# Patient Record
Sex: Male | Born: 1975 | Race: Black or African American | Hispanic: No | Marital: Single | Smoking: Current every day smoker
Health system: Southern US, Community
[De-identification: ages and names within clinical notes are randomized; demographics above are authoritative.]

## PROBLEM LIST (undated history)

## (undated) DIAGNOSIS — I10 Essential (primary) hypertension: Secondary | ICD-10-CM

## (undated) DIAGNOSIS — J45909 Unspecified asthma, uncomplicated: Secondary | ICD-10-CM

## (undated) HISTORY — PX: NO PAST SURGERIES: SHX2092

## (undated) HISTORY — PX: OTHER SURGICAL HISTORY: SHX169

---

## 2016-03-18 ENCOUNTER — Emergency Department (HOSPITAL_COMMUNITY)
Admission: EM | Admit: 2016-03-18 | Discharge: 2016-03-18 | Disposition: A | Discharge status: Home / Self Care | Payer: Self-pay | Attending: Emergency Medicine | Admitting: Emergency Medicine

## 2016-03-18 ENCOUNTER — Emergency Department (HOSPITAL_COMMUNITY): Payer: Self-pay

## 2016-03-18 ENCOUNTER — Encounter (HOSPITAL_COMMUNITY): Payer: Self-pay | Admitting: Nurse Practitioner

## 2016-03-18 DIAGNOSIS — Z760 Encounter for issue of repeat prescription: Secondary | ICD-10-CM | POA: Insufficient documentation

## 2016-03-18 DIAGNOSIS — J45901 Unspecified asthma with (acute) exacerbation: Secondary | ICD-10-CM | POA: Insufficient documentation

## 2016-03-18 DIAGNOSIS — J40 Bronchitis, not specified as acute or chronic: Secondary | ICD-10-CM

## 2016-03-18 DIAGNOSIS — R000 Tachycardia, unspecified: Secondary | ICD-10-CM | POA: Insufficient documentation

## 2016-03-18 DIAGNOSIS — R030 Elevated blood-pressure reading, without diagnosis of hypertension: Secondary | ICD-10-CM | POA: Insufficient documentation

## 2016-03-18 DIAGNOSIS — F17210 Nicotine dependence, cigarettes, uncomplicated: Secondary | ICD-10-CM | POA: Insufficient documentation

## 2016-03-18 HISTORY — DX: Unspecified asthma, uncomplicated: J45.909

## 2016-03-18 MED ORDER — ALBUTEROL SULFATE (2.5 MG/3ML) 0.083% IN NEBU: 2.5000 mg | INHALATION_SOLUTION | Freq: Four times a day (QID) | RESPIRATORY_TRACT | Status: DC | PRN

## 2016-03-18 MED ORDER — BENZONATATE 200 MG PO CAPS: 200.0000 mg | ORAL_CAPSULE | Freq: Three times a day (TID) | ORAL | Status: DC | PRN

## 2016-03-18 MED ORDER — AZITHROMYCIN 250 MG PO TABS: 250.0000 mg | ORAL_TABLET | Freq: Every day | ORAL | Status: DC

## 2016-03-18 MED ORDER — PREDNISONE 10 MG PO TABS: 20.0000 mg | ORAL_TABLET | Freq: Two times a day (BID) | ORAL | Status: DC

## 2016-03-18 MED ORDER — IPRATROPIUM-ALBUTEROL 0.5-2.5 (3) MG/3ML IN SOLN
3.0000 mL | Freq: Once | RESPIRATORY_TRACT | Status: AC
  Administered 2016-03-18: 3 mL via RESPIRATORY_TRACT
  Filled 2016-03-18: qty 3

## 2016-03-18 MED ORDER — ALBUTEROL SULFATE HFA 108 (90 BASE) MCG/ACT IN AERS: 1.0000 | INHALATION_SPRAY | Freq: Four times a day (QID) | RESPIRATORY_TRACT | Status: DC | PRN

## 2016-03-18 NOTE — ED Notes (Signed)
Patient transported to X-ray 

## 2016-03-18 NOTE — ED Provider Notes (Signed)
CSN: [id number]     Arrival date & time 03/18/16  1238 History   First MD Initiated Contact with Patient 03/18/16 1429     Chief Complaint  Patient presents with  . Asthma  . Hypertension     (Consider location/radiation/quality/duration/timing/severity/associated sxs/prior Treatment) Patient is a 40 y.o. male presenting with asthma and hypertension. The history is provided by the patient.  Asthma This is a new problem. The current episode started in the past 7 days. The problem occurs constantly. The problem has been gradually worsening. Associated symptoms include congestion and coughing.  Hypertension Associated symptoms include congestion and coughing.   Milagros LollKevin Rubenstein is a 40 y.o. male who presents to the ED with productive cough with yellow sputum and wheezing that started a week ago and has gotten worse. He complains of sinus pain. He states he has had a sinus infection once before and this feels the same. Reports the past 2 days he has used his inhaler a lot and his neb treatments without relief. Patient is an every day smoker.   Patient also wants referral to PCP since [place] [facility] [place] and does not have a doctor. He reports he went to try and give plasma 3 days in a row and they would not take his blood because his BP was high.   Past Medical History  Diagnosis Date  . Asthma    History reviewed. No pertinent past surgical history. History reviewed. No pertinent family history. Social History  Substance Use Topics  . Smoking status: Current Every Day Smoker    Types: Cigarettes  . Smokeless tobacco: None  . Alcohol Use: Yes    Review of Systems  HENT: Positive for congestion and sinus pressure.   Respiratory: Positive for cough and wheezing.   all other systems negative    Allergies  Review of patient's allergies indicates no known allergies.  Home Medications   Prior to Admission medications   Medication Sig Start Date End Date Taking?  Authorizing Provider  albuterol (PROVENTIL HFA;VENTOLIN HFA) 108 (90 Base) MCG/ACT inhaler Inhale 1-2 puffs into the lungs every 6 (six) hours as needed for wheezing or shortness of breath. 03/18/16   Jarrah Babich Orlene OchM Franciszek Platten, NP  albuterol (PROVENTIL) (2.5 MG/3ML) 0.083% nebulizer solution Take 3 mLs (2.5 mg total) by nebulization every 6 (six) hours as needed for wheezing or shortness of breath. 03/18/16   Everly Rubalcava Orlene OchM Cella Cappello, NP  azithromycin (ZITHROMAX) 250 MG tablet Take 1 tablet (250 mg total) by mouth daily. Take first 2 tablets together, then 1 every day until finished. 03/18/16   Ivianna Notch Orlene OchM Ilyaas Musto, NP  benzonatate (TESSALON) 200 MG capsule Take 1 capsule (200 mg total) by mouth 3 (three) times daily as needed for cough. 03/18/16   Ismahan Lippman Orlene OchM Advit Trethewey, NP  predniSONE (DELTASONE) 10 MG tablet Take 2 tablets (20 mg total) by mouth 2 (two) times daily with a meal. 03/18/16   Harryette Shuart Orlene OchM Chinedu Agustin, NP   BP 139/98 mmHg  Pulse 101  Temp(Src) 98.5 F (36.9 C) (Oral)  Resp 18  Ht 5\' 6"  (1.676 m)  Wt 62.143 kg  BMI 22.12 kg/m2  SpO2 100% Physical Exam  Constitutional: He is oriented to person, place, and time. He appears well-developed and well-nourished.  HENT:  Head: Normocephalic and atraumatic.  Right Ear: Tympanic membrane normal.  Left Ear: Tympanic membrane normal.  Nose: Left sinus exhibits maxillary sinus tenderness.  Mouth/Throat: Uvula is midline, oropharynx is clear and moist and mucous membranes are normal.  Eyes: EOM are normal.  Neck: Neck supple.  Cardiovascular: Regular rhythm.  Tachycardia present.   Pulmonary/Chest: Effort normal. He has decreased breath sounds. He has wheezes (occasional). Rhonchi: occasional.  Abdominal: Soft. There is no tenderness.  Musculoskeletal: Normal range of motion.  Neurological: He is alert and oriented to person, place, and time. No cranial nerve deficit.  Skin: Skin is warm and dry.  Psychiatric: He has a normal mood and affect. His behavior is normal.  Nursing note and vitals  reviewed.   ED Course  Procedures (including critical care time) Duoneb  Re examined after neb treatment and there is better air movement. More wheezing heard after treatment but better air movement. Patient states he can breathe better.   Labs Review Labs Reviewed - No data to display  Imaging Review Dg Chest 2 View  03/18/2016  CLINICAL DATA:  Persistent cough; works in a dusty factory; coughing up yellowish phlegm; No fever; Smoker 1 PPD for 25 years; No known heart problems; HTN; Hx of asthma, COPD, hX OF BRONCHITIS EXAM: CHEST - 2 VIEW COMPARISON:  None available FINDINGS: Lungs are clear. Heart size and mediastinal contours are within normal limits. No effusion.  No pneumothorax. Visualized skeletal structures are unremarkable. IMPRESSION: No acute cardiopulmonary disease. Electronically Signed   By: Corlis Leak M.D.   On: 03/18/2016 15:04    MDM  40 y.o. male with cough, congestion, wheezing and sinus pressure x 1 week stable for d/c with improvement after neb treatment. Will give Rx for refill of his albuterol inhaler, and neb tx medication. Will give Tessalon, prednisone and Z-pak. Patient to f/u with [facility]. He will return here as needed for worsening symptoms.  Discussed with the patient clinical and x-ray findings and plan of care and all questioned fully answered. Stable for d/c without respiratory distress and 02 SAT 100% on R/A and normal x-ray.   Final diagnoses:  Bronchitis       Janne Napoleon, NP 03/18/16 1542  Vanetta Mulders, MD 03/19/16 [phone]

## 2016-03-18 NOTE — ED Notes (Signed)
He c/o 1 week history of nasal congestion, wheezing, dry cough, body aches. His asthma pump ran out and he has no way to obtain refill. He also has been checking his blood pressure at the drug store and it has been elevated but hes never been diagnosed with high blood pressure. He is alert and breathing easily now

## 2017-02-06 ENCOUNTER — Emergency Department (HOSPITAL_COMMUNITY)
Admission: EM | Admit: 2017-02-06 | Discharge: 2017-02-06 | Discharge status: Against Medical Advice | Payer: Medicaid - Out of State

## 2017-02-06 NOTE — ED Notes (Signed)
Called patient, no response

## 2017-02-06 NOTE — ED Notes (Signed)
Called with no answer 

## 2017-02-06 NOTE — ED Notes (Signed)
Pt states he no longer wants to be seen.

## 2018-01-13 ENCOUNTER — Other Ambulatory Visit: Payer: Self-pay

## 2018-01-13 ENCOUNTER — Emergency Department (HOSPITAL_COMMUNITY)
Admission: EM | Admit: 2018-01-13 | Discharge: 2018-01-13 | Discharge status: Against Medical Advice | Payer: Medicaid - Out of State

## 2018-01-13 NOTE — ED Triage Notes (Signed)
Patient did not answer when called for triage x 3 

## 2018-01-16 ENCOUNTER — Encounter (HOSPITAL_COMMUNITY): Payer: Self-pay | Admitting: Emergency Medicine

## 2018-01-16 ENCOUNTER — Emergency Department (HOSPITAL_COMMUNITY)
Admission: EM | Admit: 2018-01-16 | Discharge: 2018-01-16 | Disposition: A | Discharge status: Home / Self Care | Payer: Medicaid - Out of State | Attending: Emergency Medicine | Admitting: Emergency Medicine

## 2018-01-16 DIAGNOSIS — F17210 Nicotine dependence, cigarettes, uncomplicated: Secondary | ICD-10-CM | POA: Insufficient documentation

## 2018-01-16 DIAGNOSIS — I159 Secondary hypertension, unspecified: Secondary | ICD-10-CM

## 2018-01-16 DIAGNOSIS — Z79899 Other long term (current) drug therapy: Secondary | ICD-10-CM | POA: Insufficient documentation

## 2018-01-16 DIAGNOSIS — J45909 Unspecified asthma, uncomplicated: Secondary | ICD-10-CM | POA: Insufficient documentation

## 2018-01-16 DIAGNOSIS — I10 Essential (primary) hypertension: Secondary | ICD-10-CM | POA: Insufficient documentation

## 2018-01-16 HISTORY — DX: Essential (primary) hypertension: I10

## 2018-01-16 LAB — BASIC METABOLIC PANEL
ANION GAP: 14 (ref 5–15)
CALCIUM: 9.5 mg/dL (ref 8.9–10.3)
CO2: 24 mmol/L (ref 22–32)
Chloride: 101 mmol/L (ref 101–111)
Creatinine, Ser: 0.92 mg/dL (ref 0.61–1.24)
GFR calc Af Amer: >60 mL/min (ref >60)
GLUCOSE: 93 mg/dL (ref 65–99)
POTASSIUM: 3.6 mmol/L (ref 3.5–5.1)
SODIUM: 139 mmol/L (ref 135–145)

## 2018-01-16 LAB — CBC
HCT: 42.3 % (ref 39.0–52.0)
Hemoglobin: 14.7 g/dL (ref 13.0–17.0)
MCH: 32.5 pg (ref 26.0–34.0)
MCHC: 34.8 g/dL (ref 30.0–36.0)
MCV: 93.6 fL (ref 78.0–100.0)
PLATELETS: 266 10*3/uL (ref 150–400)
RBC: 4.52 MIL/uL (ref 4.22–5.81)
RDW: 12.5 % (ref 11.5–15.5)
WBC: 4.2 10*3/uL (ref 4.0–10.5)

## 2018-01-16 MED ORDER — HYDROCHLOROTHIAZIDE 25 MG PO TABS: 25.0000 mg | ORAL_TABLET | Freq: Every day | ORAL | 0 refills | Status: DC

## 2018-01-16 NOTE — Discharge Instructions (Signed)
Please call to schedule an appointment.  Take medication as prescribed.  Return the ED with any worsening symptoms including chest pain, shortness breath, headache or vision changes.

## 2018-01-16 NOTE — ED Notes (Signed)
See provider assessment 

## 2018-01-16 NOTE — ED Notes (Signed)
Pty stable and ambulatory for d/c states understanding follow up

## 2018-01-16 NOTE — ED Triage Notes (Signed)
Pt here for eval of HBP. Pt states he moved 2 yrs ago and doesn't have PCP. Pt is asymptomatic

## 2018-01-17 NOTE — ED Provider Notes (Signed)
[facility] Provider Note   CSN: [id number] Arrival date & time: 01/16/18  2042     History   Chief Complaint Chief Complaint  Patient presents with  . Hypertension    HPI Anthony Zavala is a 42 y.o. male.  HPI 42 year old African-American male with no significant past medical history presents to the ED for evaluation of high blood pressure.  Patient denies any history of hypertension.  Does report history of hyperlipidemia.  He states this was diagnosed 4 years ago his physical.  Patient states that [place] ago [place].  Patient has not establish care.  He reports having ongoing high blood pressure.  Patient denies any associated symptoms with this including chest pain, shortness of breath, vision changes, headache, lightheadedness or dizziness.  Patient states that people have told him of his blood pressure is high he may have a stroke.  Patient has not seen a primary care doctor to get his blood pressure under control.  He does report being very anxious.  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  Patient states that his blood pressure is high when he goes into the jail and they will not let him in with high blood pressure.  Again patient denies any associated symptoms.  He is requesting blood pressure medication at a primary care follow-up.  Pt denies any fever, chill, ha, vision changes, lightheadedness, dizziness, congestion, neck pain, cp, sob, cough, abd pain, n/v/d, urinary symptoms, change in bowel habits, melena, hematochezia, lower extremity paresthesias.  Past Medical History:  Diagnosis Date  . Asthma   . Hypertension     There are no active problems to display for this patient.   History reviewed. No pertinent surgical history.     Home Medications    Prior to Admission medications   Medication Sig Start Date End Date Taking?  Authorizing Provider  albuterol (PROVENTIL HFA;VENTOLIN HFA) 108 (90 Base) MCG/ACT inhaler Inhale 1-2 puffs into the lungs every 6 (six) hours as needed for wheezing or shortness of breath. 03/18/16   Janne NapoleonNeese, Hope M, NP  albuterol (PROVENTIL) (2.5 MG/3ML) 0.083% nebulizer solution Take 3 mLs (2.5 mg total) by nebulization every 6 (six) hours as needed for wheezing or shortness of breath. 03/18/16   Janne NapoleonNeese, Hope M, NP  azithromycin (ZITHROMAX) 250 MG tablet Take 1 tablet (250 mg total) by mouth daily. Take first 2 tablets together, then 1 every day until finished. 03/18/16   Janne NapoleonNeese, Hope M, NP  benzonatate (TESSALON) 200 MG capsule Take 1 capsule (200 mg total) by mouth 3 (three) times daily as needed for cough. 03/18/16   Janne NapoleonNeese, Hope M, NP  hydrochlorothiazide (HYDRODIURIL) 25 MG tablet Take 1 tablet (25 mg total) by mouth daily. 01/16/18   Rise MuLeaphart, Eh Sauseda T, PA-C  predniSONE (DELTASONE) 10 MG tablet Take 2 tablets (20 mg total) by mouth 2 (two) times daily with a meal. 03/18/16   Janne NapoleonNeese, Hope M, NP    Family History No family history on file.  Social History Social History   Tobacco Use  . Smoking status: Current Every Day Smoker    Types: Cigarettes  Substance Use Topics  . Alcohol use: Yes  . Drug use: No     Allergies   Patient has no known allergies.   Review of Systems Review of Systems  Constitutional: Negative for chills, diaphoresis and fever.  HENT: Negative for congestion.  Eyes: Negative for visual disturbance.  Respiratory: Negative for cough and shortness of breath.   Cardiovascular: Negative for chest pain, palpitations and leg swelling.  Gastrointestinal: Negative for abdominal pain, diarrhea, nausea and vomiting.  Genitourinary: Negative for dysuria, flank pain, frequency, hematuria and urgency.  Musculoskeletal: Negative for arthralgias and myalgias.  Skin: Negative for rash.  Neurological: Negative for dizziness, syncope, weakness, light-headedness, numbness and  headaches.  Psychiatric/Behavioral: Negative for sleep disturbance. The patient is not nervous/anxious.      Physical Exam Updated Vital Signs BP (!) 152/123 (BP Location: Right Arm)   Pulse 97   Temp 97.9 F (36.6 C) (Oral)   Resp 17   Ht 5\' 8"  (1.727 m)   Wt 74.8 kg (165 lb)   SpO2 94%   BMI 25.09 kg/m   Physical Exam  Constitutional: He appears well-developed and well-nourished. No distress.  HENT:  Head: Normocephalic and atraumatic.  Eyes: Right eye exhibits no discharge. Left eye exhibits no discharge. No scleral icterus.  Neck: Normal range of motion.  Cardiovascular: Normal rate, regular rhythm, normal heart sounds and intact distal pulses. Exam reveals no gallop and no friction rub.  No murmur heard. Pulmonary/Chest: Effort normal and breath sounds normal. No stridor. No respiratory distress. He has no wheezes. He has no rales. He exhibits no tenderness.  Musculoskeletal: Normal range of motion.  Neurological: He is alert.  Skin: No pallor.  Psychiatric: His behavior is normal. Judgment and thought content normal.  Nursing note and vitals reviewed.    ED Treatments / Results  Labs (all labs ordered are listed, but only abnormal results are displayed) Labs Reviewed  BASIC METABOLIC PANEL - Abnormal; Notable for the following components:      Result Value   BUN <5 (*)    All other components within normal limits  CBC    EKG  EKG Interpretation None       Radiology No results found.  Procedures Procedures (including critical care time)  Medications Ordered in ED Medications - No data to display   Initial Impression / Assessment and Plan / ED Course  I have reviewed the triage vital signs and the nursing notes.  Pertinent labs & imaging results that were available during my care of the patient were reviewed by me and considered in my medical decision making (see chart for details).     Patient noted to be hypertensive in the emergency  department.  Normal lab work including creatinine.  Patient denies any associated symptoms of chest pain or shortness of breath.  I do not feel that cardiac workup is indicated.  Patient has a symptom medic hypertension.  Patient has had very difficult time finding primary care.  Patient is incarcerated on the weekends and states that he needs his blood pressure under control.  Discussed with my attending and will prescribe patient with a short course of HCTZ.  Have discussed with care management and they have given patient follow-up with [facility].  Patient instructed to follow-up tomorrow with an appointment.  No signs of hypertensive urgency.  Pt is hemodynamically stable, in NAD, & able to ambulate in the ED. Evaluation does not show pathology that would require ongoing emergent intervention or inpatient treatment. I explained the diagnosis to the patient. Pain has been managed & has no complaints prior to dc. Pt is comfortable with above plan and is stable for discharge at this time. All questions were answered prior to disposition. Strict return precautions for f/u to  the ED were discussed. Encouraged follow up with PCP.   Final Clinical Impressions(s) / ED Diagnoses   Final diagnoses:  Secondary hypertension    ED Discharge Orders        Ordered    hydrochlorothiazide (HYDRODIURIL) 25 MG tablet  Daily     01/16/18 2333       Rise Mu, PA-C 01/17/18 0035    Raeford Razor, MD 01/24/18 [phone]

## 2018-08-09 ENCOUNTER — Encounter (HOSPITAL_COMMUNITY): Payer: Self-pay | Admitting: *Deleted

## 2018-08-09 ENCOUNTER — Emergency Department (HOSPITAL_COMMUNITY)
Admission: EM | Admit: 2018-08-09 | Discharge: 2018-08-09 | Disposition: A | Discharge status: Home / Self Care | Payer: Self-pay | Attending: Emergency Medicine | Admitting: Emergency Medicine

## 2018-08-09 ENCOUNTER — Other Ambulatory Visit: Payer: Self-pay

## 2018-08-09 ENCOUNTER — Emergency Department (HOSPITAL_COMMUNITY): Payer: Self-pay

## 2018-08-09 DIAGNOSIS — Z79899 Other long term (current) drug therapy: Secondary | ICD-10-CM | POA: Insufficient documentation

## 2018-08-09 DIAGNOSIS — S42035A Nondisplaced fracture of lateral end of left clavicle, initial encounter for closed fracture: Secondary | ICD-10-CM | POA: Insufficient documentation

## 2018-08-09 DIAGNOSIS — Y929 Unspecified place or not applicable: Secondary | ICD-10-CM | POA: Insufficient documentation

## 2018-08-09 DIAGNOSIS — S42002A Fracture of unspecified part of left clavicle, initial encounter for closed fracture: Secondary | ICD-10-CM

## 2018-08-09 DIAGNOSIS — Y990 Civilian activity done for income or pay: Secondary | ICD-10-CM | POA: Insufficient documentation

## 2018-08-09 DIAGNOSIS — J45909 Unspecified asthma, uncomplicated: Secondary | ICD-10-CM | POA: Insufficient documentation

## 2018-08-09 DIAGNOSIS — I10 Essential (primary) hypertension: Secondary | ICD-10-CM | POA: Insufficient documentation

## 2018-08-09 DIAGNOSIS — M545 Low back pain: Secondary | ICD-10-CM | POA: Insufficient documentation

## 2018-08-09 DIAGNOSIS — W208XXA Other cause of strike by thrown, projected or falling object, initial encounter: Secondary | ICD-10-CM | POA: Insufficient documentation

## 2018-08-09 DIAGNOSIS — F17210 Nicotine dependence, cigarettes, uncomplicated: Secondary | ICD-10-CM | POA: Insufficient documentation

## 2018-08-09 DIAGNOSIS — Y9389 Activity, other specified: Secondary | ICD-10-CM | POA: Insufficient documentation

## 2018-08-09 MED ORDER — OXYCODONE-ACETAMINOPHEN 5-325 MG PO TABS: 1.0000 | ORAL_TABLET | Freq: Four times a day (QID) | ORAL | 0 refills | Status: DC | PRN

## 2018-08-09 NOTE — ED Provider Notes (Signed)
[facility] EM[facility]GENC[facility]PART[facility] Provider Note   CSN: [id number] Arrival date & time: 08/09/18  0747     History   Chief Complaint Chief Complaint  Patient presents with  . Fall    HPI Anthony Zavala is a 42 y.o. male with a history of asthma and hypertension who presents to the emergency department status post injury 3 days prior with complaints of L shoulder pain and lower back pain. 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  No head injury or loss of consciousness.  He states he is been having constant severe pain to the left shoulder and moderate pain to the lower back.  It is worse with movement, mildly alleviated with Goody powder at home.  States he feels like he cannot move the left shoulder secondary to pain, not necessarily weakness.  Denies numbness, tingling, weakness, or any other areas of injury.  HPI  Past Medical History:  Diagnosis Date  . Asthma   . Hypertension     There are no active problems to display for this patient.   History reviewed. No pertinent surgical history.      Home Medications    Prior to Admission medications   Medication Sig Start Date End Date Taking? Authorizing Provider  albuterol (PROVENTIL HFA;VENTOLIN HFA) 108 (90 Base) MCG/ACT inhaler Inhale 1-2 puffs into the lungs every 6 (six) hours as needed for wheezing or shortness of breath. 03/18/16   Janne NapoleonNeese, Hope M, NP  albuterol (PROVENTIL) (2.5 MG/3ML) 0.083% nebulizer solution Take 3 mLs (2.5 mg total) by nebulization every 6 (six) hours as needed for wheezing or shortness of breath. 03/18/16   Janne NapoleonNeese, Hope M, NP  azithromycin (ZITHROMAX) 250 MG tablet Take 1 tablet (250 mg total) by mouth daily. Take first 2 tablets together, then 1 every day until finished. 03/18/16   Janne NapoleonNeese, Hope M, NP  benzonatate (TESSALON) 200 MG  capsule Take 1 capsule (200 mg total) by mouth 3 (three) times daily as needed for cough. 03/18/16   Janne NapoleonNeese, Hope M, NP  hydrochlorothiazide (HYDRODIURIL) 25 MG tablet Take 1 tablet (25 mg total) by mouth daily. 01/16/18   Rise MuLeaphart, Kenneth T, PA-C  predniSONE (DELTASONE) 10 MG tablet Take 2 tablets (20 mg total) by mouth 2 (two) times daily with a meal. 03/18/16   Janne NapoleonNeese, Hope M, NP    Family History History reviewed. No pertinent family history.  Social History Social History   Tobacco Use  . Smoking status: Current Every Day Smoker    Types: Cigarettes  Substance Use Topics  . Alcohol use: Yes  . Drug use: No    Allergies   Patient has no known allergies.   Review of Systems Review of Systems  Cardiovascular: Negative for chest pain.  Gastrointestinal: Negative for abdominal pain.  Musculoskeletal: Positive for arthralgias (L shoulder) and back pain.  Neurological: Negative for weakness and numbness.       Negative for paresthesias     Physical Exam Updated Vital Signs BP (!) 140/107 (BP Location: Right Arm)   Pulse 87   Temp 98 F (36.7 C) (Oral)   Resp 20   SpO2 100%   Physical Exam  Constitutional: He appears well-developed and well-nourished.  Non-toxic appearance. No distress.  HENT:  Head: Normocephalic and atraumatic.  Eyes: Pupils are equal, round, and reactive  to light. Conjunctivae are normal. Right eye exhibits no discharge. Left eye exhibits no discharge.  Neck: Normal range of motion. Neck supple. No spinous process tenderness and no muscular tenderness present.  Cardiovascular: Normal rate and regular rhythm.  Pulses:      Radial pulses are 2+ on the right side, and 2+ on the left side.  Pulmonary/Chest: Effort normal and breath sounds normal. No respiratory distress. He has no wheezes. He has no rhonchi. He has no rales.  Respiration even and unlabored  Abdominal: Soft. He exhibits no distension. There is no tenderness.  Musculoskeletal:  No  appreciable swelling, erythema, ecchymosis, or open wounds. Upper extremities: Patient has normal range of motion at all joints of the right upper extremity as well as to the elbow wrist and all digits of the left upper extremity.  He is able to somewhat move the left shoulder in all directions, however significant limitation in flexion and abduction equivocal to pain.  He is tender to palpation in to lateral aspect of the clavicle, left AC joint area as well as the diffuse glenohumeral joint. Upper extremities are otherwise nontender. Back: Patient has diffuse midline lumbar tenderness to palpation which extends to bilateral lumbar paraspinal muscles, no point/focal tenderness, no palpable step-off.  No tenderness to thoracic or cervical areas. Extremities: Normal range of motion.  Nontender.  Neurological: He is alert.  Clear speech. 5/5 symmetric grip strength. 5/5 strength with plantar/dorsiflexion bilaterally.  Sensation grossly intact bilateral upper and lower extremities.  Patient able to perform okay sign, thumbs up, and cross second and third digits.  Patient is ambulatory without difficulty.  Skin: Skin is warm and dry. No rash noted.  Psychiatric: He has a normal mood and affect. His behavior is normal. Thought content normal.  Nursing note and vitals reviewed.    ED Treatments / Results  Labs (all labs ordered are listed, but only abnormal results are displayed) Labs Reviewed - No data to display  EKG None  Radiology Dg Lumbar Spine Complete  Result Date: 08/09/2018 CLINICAL DATA:  Low back pain following fall 2 days ago, initial encounter EXAM: LUMBAR SPINE - COMPLETE 4+ VIEW COMPARISON:  None. FINDINGS: Five lumbar type vertebral bodies are well visualized. Vertebral body height is well maintained. No pars defects are seen. No anterolisthesis is noted. No soft tissue abnormality is noted. IMPRESSION: No acute abnormality noted. Electronically Signed   By: Alcide Clever M.D.   On:  08/09/2018 09:01   Dg Shoulder Left  Result Date: 08/09/2018 CLINICAL DATA:  Left shoulder pain following fall 2 days ago with persistent pain, initial encounter EXAM: LEFT SHOULDER - 2+ VIEW COMPARISON:  None. FINDINGS: Humeral head is well seated. Mild degenerative changes of the acromioclavicular joint is seen. Additionally there is cortical irregularities consistent with a undisplaced fracture of the distal clavicle. A lucency is noted in the superior aspect of the proximal clavicle which may also represent a focal avulsion. No soft tissue abnormality is seen. No other focal abnormality is noted. IMPRESSION: Changes suspicious for undisplaced fractures in the proximal and distal clavicle on the left. Electronically Signed   By: Alcide Clever M.D.   On: 08/09/2018 08:57    Procedures Procedures (including critical care time)  SPLINT APPLICATION Date/Time: 9:38 AM Authorized by: Harvie Heck Consent: Verbal consent obtained. Risks and benefits: risks, benefits and alternatives were discussed Consent given by: patient Splint applied by: orthopedic technician Location details: LUE Splint type: sling/immobilizer Post-procedure: The splinted body part was neurovascularly  unchanged following the procedure. Patient tolerance: Patient tolerated the procedure well with no immediate complications.  Medications Ordered in ED Medications - No data to display   Initial Impression / Assessment and Plan / ED Course  I have reviewed the triage vital signs and the nursing notes.  Pertinent labs & imaging results that were available during my care of the patient were reviewed by me and considered in my medical decision making (see chart for details).   Patient presents to the ED s/p mechanical injury with dresser falling onto L shoulder with subsequent fall to the ground. Nontoxic appearing, vitals WNL other than elevated BP, doubt HTN emergency, patient aware of need for recheck. L shoulder  and lumbar area diffusely tender to palpation- plain films will be obtained.  No evidence of serious head, neck, or back injury, per [place] head CT/C-spine rules do not feel that further imaging is necessary in these locations, plain film of lumbar area negative, patient without point/focal tenderness or noted neurologic deficits- do not suspect fracture/dislocation of the spine or head bleed.  Left shoulder x-ray suspicious for undisplaced fractures in the proximal and distal clavicle on the left-he is neurovascularly intact distally will place in a sling with orthopedics follow-up and pain control. [place] Controlled Substance reporting System queried. Work note provided. I discussed results, treatment plan, need for  follow-up, and return precautions with the patient. Provided opportunity for questions, patient confirmed understanding and is in agreement with plan.   Findings and plan of care discussed with supervising physician Dr. Rush Landmark- in agreement.   Final Clinical Impressions(s) / ED Diagnoses   Final diagnoses:  Closed nondisplaced fracture of left clavicle, unspecified part of clavicle, initial encounter    ED Discharge Orders         Ordered    oxyCODONE-acetaminophen (PERCOCET/ROXICET) 5-325 MG tablet  Every 6 hours PRN     08/09/18 0948           Cherly Anderson, PA-C 08/09/18 1610    Tegeler, Canary Brim, MD 08/09/18 [phone]

## 2018-08-09 NOTE — ED Triage Notes (Signed)
Pt in c/o left shoulder pain after a fall at work on Thursday, pain started the next day and has gotten worse, also some back pain, no distress noted

## 2018-08-09 NOTE — Discharge Instructions (Signed)
Please read and follow all provided instructions.  You have been seen today for left shoulder pain and lower back pain.  Your left shoulder x-ray does show that you have fractures to your clavicle, we are placing you in a sling, please wear this at all times you have followed up with the orthopedic doctor.  We are sending you home with pain medicines.  We would like you to follow-up with the orthopedic doctor your discharge instruction in 3 to 5 days for reevaluation and further management.  Tests performed today include: X-ray of the lower back: Normal X-ray of the left shoulder: Shows that you have nondisplaced fractures to both ends of your clavicle on the left side. Vital signs. See below for your results today.   Home care instructions: -- *PRICE in the first 24-48 hours after injury: Protect (with brace, splint, sling), if given by your provider Rest Ice- Do not apply ice pack directly to your skin, place towel or similar between your skin and ice/ice pack. Apply ice for 20 min, then remove for 40 min while awake Compression- Wear brace, elastic bandage, splint as directed by your provider Elevate affected extremity above the level of your heart when not walking around for the first 24-48 hours   Medications:  -Percocet-this is a narcotic/controlled substance medication that has potential addicting qualities.  We recommend that you take 1-2 tablets every 6 hours as needed for severe pain.  Do not drive or operate heavy machinery when taking this medicine as it can be sedating. Do not drink alcohol or take other sedating medications when taking this medicine for safety reasons.  Keep this out of reach of small children.  Please be aware this medicine has Tylenol in it (325 mg/tab) do not exceed the maximum dose of Tylenol in a day per over the counter recommendations should you decide to supplement with Tylenol over the counter.   We would also like you to take an NSAID over-the-counter for  pain such as ibuprofen, Advil, Aleve, or Motrin-use over-the-counter dosing instructions.  We have prescribed you new medication(s) today. Discuss the medications prescribed today with your pharmacist as they can have adverse effects and interactions with your other medicines including over the counter and prescribed medications. Seek medical evaluation if you start to experience new or abnormal symptoms after taking one of these medicines, seek care immediately if you start to experience difficulty breathing, feeling of your throat closing, facial swelling, or rash as these could be indications of a more serious allergic reaction   Follow-up instructions: Follow-up with Dr. Magnus IvanBlackman in 3 to 5 days.  Return instructions:  Please return if your digits or extremity are numb or tingling, appear gray or blue, or you have severe pain (also elevate the extremity and loosen splint or wrap if you were given one) Please return if you have redness or fevers.  Please return to the Emergency Department if you experience worsening symptoms.  Please return if you have any other emergent concerns. Additional Information:  Your vital signs today were: BP (!) 140/107 (BP Location: Right Arm)    Pulse 87    Temp 98 F (36.7 C) (Oral)    Resp 20    SpO2 100%  If your blood pressure (BP) was elevated above 135/85 this visit, please have this repeated by your doctor within one month. ---------------

## 2018-08-12 ENCOUNTER — Ambulatory Visit (INDEPENDENT_AMBULATORY_CARE_PROVIDER_SITE_OTHER): Payer: Self-pay | Admitting: Family Medicine

## 2018-08-12 DIAGNOSIS — S42035A Nondisplaced fracture of lateral end of left clavicle, initial encounter for closed fracture: Secondary | ICD-10-CM

## 2018-08-12 DIAGNOSIS — M25511 Pain in right shoulder: Secondary | ICD-10-CM

## 2018-08-12 NOTE — Progress Notes (Signed)
   Office Visit Note   Patient: Anthony Zavala           Date of Birth: 04/16/1976           MRN: [id number] Visit Date: 08/12/2018 Requested by: No referring provider defined for this encounter. PCP: Patient, No Pcp Per  Subjective: Chief Complaint  Patient presents with  . Left Shoulder - Injury, Pain    Left clavicle fracture - fell on pavement 08/09/18. [facility]. In [place]. Right-hand dominant.    HPI: He is a 42 year old right-hand-dominant male with right shoulder pain and left clavicle fracture.  On September 14 he was running, tripped and fell landing on his shoulders.  Immediate pain in both shoulders.  He continued to have pain so he went to the hospital where x-rays revealed left shoulder distal clavicle fracture and possibly proximal clavicle avulsion fracture.  He was given a shoulder sling for comfort.  He states that his right shoulder hurts more than the left, very difficult to raise his arm overhead.  He has been in good physical health.  He works for a [employer].  He has been unable to work since his injuries.  He smokes cigarettes and is trying to quit.              ROS: Otherwise noncontributory.  Objective: Vital Signs: There were no vitals taken for this visit.  Physical Exam:  Right shoulder: Full active range of motion with pain reaching overhead.  Pain with empty can test but still good rotator cuff strength.  Speeds test is negative.  No detectable instability. Left shoulder: Tender at the proximal and distal clavicle.  There is slight bony prominence of the [facility] compared to the right side.  Did not put him through active range of motion in the left shoulder.  Imaging: X-rays from the hospital were reviewed.  Assessment & Plan: 1.  Left shoulder pain 3 days status post fall with distal and probably proximal clavicle fractures, question injury to the coracoclavicular ligament. -Shoulder sling for comfort, gentle pendulum exercises for  range of motion.  Return in 2 weeks for repeat clavicle x-ray.  May be able to return to light duty work in 3 weeks, with regular duty in 6 weeks if clinically healed.  2.  Right shoulder pain, suspect rotator cuff strain -Range of motion as tolerated.  Additional imaging if pain persist.   Follow-Up Instructions: Return in about 3 weeks (around 09/02/2018).       Procedures: None today.   PMFS History: There are no active problems to display for this patient.  Past Medical History:  Diagnosis Date  . Asthma   . Hypertension     No family history on file.  No past surgical history on file. Social History   Occupational History  . Not on file  Tobacco Use  . Smoking status: Current Every Day Smoker    Types: Cigarettes  Substance and Sexual Activity  . Alcohol use: Yes  . Drug use: No  . Sexual activity: Not on file

## 2018-08-13 ENCOUNTER — Encounter (INDEPENDENT_AMBULATORY_CARE_PROVIDER_SITE_OTHER): Payer: Self-pay

## 2018-08-13 ENCOUNTER — Telehealth (INDEPENDENT_AMBULATORY_CARE_PROVIDER_SITE_OTHER): Payer: Self-pay | Admitting: Family Medicine

## 2018-08-13 ENCOUNTER — Other Ambulatory Visit (INDEPENDENT_AMBULATORY_CARE_PROVIDER_SITE_OTHER): Payer: Self-pay | Admitting: Family Medicine

## 2018-08-13 MED ORDER — OXYCODONE-ACETAMINOPHEN 5-325 MG PO TABS: 1.0000 | ORAL_TABLET | Freq: Three times a day (TID) | ORAL | 0 refills | Status: DC | PRN

## 2018-08-13 NOTE — Telephone Encounter (Signed)
Patient advised.  He will come and pick up the note from the front desk.

## 2018-08-13 NOTE — Telephone Encounter (Signed)
Rx sent.  Work note printed again.

## 2018-08-13 NOTE — Telephone Encounter (Signed)
Need pharmacy info and I will send electronically.

## 2018-08-13 NOTE — Telephone Encounter (Signed)
The patient uses [facility] on [address].  He does need a note that says he is out of work and for how long.

## 2018-08-13 NOTE — Telephone Encounter (Signed)
Please advise 

## 2018-08-13 NOTE — Telephone Encounter (Signed)
Patient came in to state needed pain meds-seen Dr Prince RomeHilts yesterday and forgot to mention he needed some. He only received 5 from ED.  He also needs a note for work to state he will be out of work.  Please call this patient as soon as you can he was wanting to sit and wait to get a RX.  [phone]

## 2018-08-22 ENCOUNTER — Emergency Department (HOSPITAL_COMMUNITY): Payer: Self-pay

## 2018-08-22 ENCOUNTER — Emergency Department (HOSPITAL_COMMUNITY)
Admission: EM | Admit: 2018-08-22 | Discharge: 2018-08-22 | Disposition: A | Discharge status: Home / Self Care | Payer: Self-pay | Attending: Emergency Medicine | Admitting: Emergency Medicine

## 2018-08-22 ENCOUNTER — Encounter (HOSPITAL_COMMUNITY): Payer: Self-pay | Admitting: Emergency Medicine

## 2018-08-22 ENCOUNTER — Other Ambulatory Visit: Payer: Self-pay

## 2018-08-22 DIAGNOSIS — S43421A Sprain of right rotator cuff capsule, initial encounter: Secondary | ICD-10-CM | POA: Insufficient documentation

## 2018-08-22 DIAGNOSIS — Y929 Unspecified place or not applicable: Secondary | ICD-10-CM | POA: Insufficient documentation

## 2018-08-22 DIAGNOSIS — J45909 Unspecified asthma, uncomplicated: Secondary | ICD-10-CM | POA: Insufficient documentation

## 2018-08-22 DIAGNOSIS — Y990 Civilian activity done for income or pay: Secondary | ICD-10-CM | POA: Insufficient documentation

## 2018-08-22 DIAGNOSIS — I10 Essential (primary) hypertension: Secondary | ICD-10-CM | POA: Insufficient documentation

## 2018-08-22 DIAGNOSIS — W230XXA Caught, crushed, jammed, or pinched between moving objects, initial encounter: Secondary | ICD-10-CM | POA: Insufficient documentation

## 2018-08-22 DIAGNOSIS — Y939 Activity, unspecified: Secondary | ICD-10-CM | POA: Insufficient documentation

## 2018-08-22 DIAGNOSIS — F17210 Nicotine dependence, cigarettes, uncomplicated: Secondary | ICD-10-CM | POA: Insufficient documentation

## 2018-08-22 DIAGNOSIS — Z79899 Other long term (current) drug therapy: Secondary | ICD-10-CM | POA: Insufficient documentation

## 2018-08-22 DIAGNOSIS — S43429D Sprain of unspecified rotator cuff capsule, subsequent encounter: Secondary | ICD-10-CM

## 2018-08-22 MED ORDER — MELOXICAM 15 MG PO TABS
15.0000 mg | ORAL_TABLET | Freq: Every day | ORAL | 0 refills | Status: DC
Start: 2018-08-22 — End: 2018-11-04

## 2018-08-22 MED ORDER — OXYCODONE-ACETAMINOPHEN 5-325 MG PO TABS
1.0000 | ORAL_TABLET | ORAL | Status: DC | PRN
  Administered 2018-08-22: 1 via ORAL
  Filled 2018-08-22: qty 1

## 2018-08-22 NOTE — ED Triage Notes (Signed)
Pt reports fall one week ago to rt shoulder but broke left clavicle. Pt was seen and treated for the left arm, but reports rt arm hurts the most. Pt wearing sling to left arm. Denies further injuries. Pt reports a popping noise and pain when lifting right arm. Pt has been taking pain medication prescription but has ran out. Pt reports he has been taking ibuprofen with no relief in the mean time.

## 2018-08-22 NOTE — ED Provider Notes (Signed)
[facility] EMERGENCY DEPARTMENT Provider Note   CSN: [id number] Arrival date & time: 08/22/18  1630     History   Chief Complaint Chief Complaint  Patient presents with  . Shoulder Pain    HPI Anthony Zavala is a 42 y.o. male who presents the emergency department for evaluation of right shoulder pain.Patient was dx with left clavicle fracture on 08/09/2018 after injury and work where a dresser fell on him. He was seen by Dr. Prince Rome at [facility] orthopedics. He was diagnosed with r rotator cuff strain. He Received 10 Percocet tablets from the ER on 08/09/2017 and 30 tablets on 08/13/2018.  Patient states that he is having a lot of difficulty sleeping because he is on his back which is uncomfortable.  He states that his pain is worse when he first wakes up and try to sleep at night.  Percocet he was taking was helpful however he has he has been using ibuprofen is not significant relief.  Patient was unaware that he was diagnosed with a right rotator cuff tear.  He denies numbness tingling or weakness.  Pain is worse when he tries to raise his shoulder above 90 degrees.  HPI  Past Medical History:  Diagnosis Date  . Asthma   . Hypertension     There are no active problems to display for this patient.   History reviewed. No pertinent surgical history.      Home Medications    Prior to Admission medications   Medication Sig Start Date End Date Taking? Authorizing Provider  albuterol (PROVENTIL HFA;VENTOLIN HFA) 108 (90 Base) MCG/ACT inhaler Inhale 1-2 puffs into the lungs every 6 (six) hours as needed for wheezing or shortness of breath. 03/18/16   Janne Napoleon, NP  albuterol (PROVENTIL) (2.5 MG/3ML) 0.083% nebulizer solution Take 3 mLs (2.5 mg total) by nebulization every 6 (six) hours as needed for wheezing or shortness of breath. Patient not taking: Reported on 08/12/2018 03/18/16   Janne Napoleon, NP  azithromycin (ZITHROMAX) 250 MG tablet Take 1 tablet (250 mg  total) by mouth daily. Take first 2 tablets together, then 1 every day until finished. Patient not taking: Reported on 08/12/2018 03/18/16   Janne Napoleon, NP  benzonatate (TESSALON) 200 MG capsule Take 1 capsule (200 mg total) by mouth 3 (three) times daily as needed for cough. Patient not taking: Reported on 08/12/2018 03/18/16   Janne Napoleon, NP  hydrochlorothiazide (HYDRODIURIL) 25 MG tablet Take 1 tablet (25 mg total) by mouth daily. Patient not taking: Reported on 08/12/2018 01/16/18   Demetrios Loll T, PA-C  oxyCODONE-acetaminophen (PERCOCET/ROXICET) 5-325 MG tablet Take 1 tablet by mouth every 8 (eight) hours as needed for severe pain. 08/13/18   Hilts, Casimiro Needle, MD  predniSONE (DELTASONE) 10 MG tablet Take 2 tablets (20 mg total) by mouth 2 (two) times daily with a meal. Patient not taking: Reported on 08/12/2018 03/18/16   Janne Napoleon, NP    Family History No family history on file.  Social History Social History   Tobacco Use  . Smoking status: Current Every Day Smoker    Packs/day: 0.50    Types: Cigarettes  . Smokeless tobacco: Never Used  Substance Use Topics  . Alcohol use: Yes  . Drug use: No     Allergies   Patient has no known allergies.   Review of Systems Review of Systems Ten systems reviewed and are negative for acute change, except as noted in the HPI.  Physical Exam Updated Vital Signs BP (!) 130/95 (BP Location: Right Arm)   Pulse (!) 102   Temp 99.1 F (37.3 C) (Oral)   Resp 18   Ht 5\' 6"  (1.676 m)   Wt 67.1 kg   SpO2 98%   BMI 23.89 kg/m   Physical Exam Physical Exam  Nursing note and vitals reviewed. Constitutional: He appears well-developed and well-nourished. No distress.  HENT:  Head: Normocephalic and atraumatic.  Eyes: Conjunctivae normal are normal. No scleral icterus.  Neck: Normal range of motion. Neck supple.  Cardiovascular: Normal rate, regular rhythm and normal heart sounds.   Pulmonary/Chest: Effort normal and breath  sounds normal. No respiratory distress.  Abdominal: Soft. There is no tenderness.  Musculoskeletal: Patient's left arm is in a sling.  Patient is able to use the right arm but has pain with active range of motion past 80 degrees.  Shoulder is tender.  He has normal pulse and equal bilateral grip strength.  When Neurological: He is alert.  Skin: Skin is warm and dry. He is not diaphoretic.  Psychiatric: His behavior is normal.     ED Treatments / Results  Labs (all labs ordered are listed, but only abnormal results are displayed) Labs Reviewed - No data to display  EKG None  Radiology No results found.  Procedures Procedures (including critical care time)  Medications Ordered in ED Medications  oxyCODONE-acetaminophen (PERCOCET/ROXICET) 5-325 MG per tablet 1 tablet (1 tablet Oral Given 08/22/18 1649)     Initial Impression / Assessment and Plan / ED Course  I have reviewed the triage vital signs and the nursing notes.  Pertinent labs & imaging results that were available during my care of the patient were reviewed by me and considered in my medical decision making (see chart for details).    Patient with pain in his right and left shoulders after injury 2 weeks ago.  He received narcotics.  Patient will be discharged without narcotics but is encouraged to follow-up with his orthopod if he needs any further pain control outside of anti-inflammatories or Tylenol.  He was seen by the case manager who has also set him up for physical therapy.  Patient otherwise appears appropriate for discharge at this time.   Final Clinical Impressions(s) / ED Diagnoses   Final diagnoses:  None    ED Discharge Orders    None       Arthor Captain, PA-C 08/23/18 0148    Benjiman Core, MD 08/25/18 [phone]

## 2018-08-22 NOTE — ED Provider Notes (Signed)
Patient placed in Quick Look pathway, seen and evaluated   Chief Complaint: shoulder pain right  HPI: Anthony Zavala is a 42 y.o. who presents to the ED with right shoulder pain. Patient reports falling last week and had fracture to the left clavicle but but reports continued pain to the right.   ROS: M/S: right shoulder pain  Physical Exam:  BP (!) 130/95 (BP Location: Right Arm)   Pulse (!) 102   Temp 99.1 F (37.3 C) (Oral)   Resp 18   Ht 5\' 6"  (1.676 m)   Wt 67.1 kg   SpO2 98%   BMI 23.89 kg/m    Gen: No distress  Neuro: Awake and Alert  Skin: Warm and dry  M/S: tender with palpation right shoulder   Initiation of care has begun. The patient has been counseled on the process, plan, and necessity for staying for the completion/evaluation, and the remainder of the medical screening examination    Janne Napoleon, NP 08/22/18 1655    Derwood Kaplan, MD 08/22/18 1840

## 2018-08-22 NOTE — Discharge Instructions (Addendum)
Take one Mobic daily with food.  You may take over-the-counter Tylenol as directed on the bottle.  Do not exceed the amount of medication directed daily (3000 mg).  These follow-up with Dr. Prince Rome for further pain medication.  Not combine Tylenol with Percocet as this can damage her liver.  Apply ice to the affected area about 5 times a day for 20 minutes at a time.   Get help right away if: You have a fever. You cannot move your arm or shoulder. You develop severe numbness or tingling in your arm, hand, or fingers. Your arm, hand, or fingers turn blue, white, or gray and feel cold.

## 2018-08-25 ENCOUNTER — Telehealth (INDEPENDENT_AMBULATORY_CARE_PROVIDER_SITE_OTHER): Payer: Self-pay | Admitting: Family Medicine

## 2018-08-25 NOTE — Telephone Encounter (Signed)
Please see message below for Rx refill.  Please advise.  Thank you.

## 2018-08-25 NOTE — Telephone Encounter (Signed)
Patient called needing Rx refilled (Oxycodone) Patient asked if he can get the Oxycodone 10 mg. Patient said the Oxycodone 5 mg  is not strong enough. The number to contact patient is [phone]

## 2018-08-26 NOTE — Telephone Encounter (Signed)
Talked with patient and advised him of message below per Dr. Hilts. 

## 2018-08-26 NOTE — Telephone Encounter (Signed)
Sorry, but can only provide one Rx for narcotic pain meds.  He shouldn't need more than tylenol at this point.  Can come in for recheck sooner than scheduled if he's hurting that badly.

## 2018-08-27 ENCOUNTER — Ambulatory Visit (INDEPENDENT_AMBULATORY_CARE_PROVIDER_SITE_OTHER): Payer: Self-pay | Admitting: Family Medicine

## 2018-09-02 ENCOUNTER — Ambulatory Visit (INDEPENDENT_AMBULATORY_CARE_PROVIDER_SITE_OTHER): Payer: Self-pay

## 2018-09-02 ENCOUNTER — Encounter (INDEPENDENT_AMBULATORY_CARE_PROVIDER_SITE_OTHER): Payer: Self-pay | Admitting: Family Medicine

## 2018-09-02 ENCOUNTER — Ambulatory Visit (INDEPENDENT_AMBULATORY_CARE_PROVIDER_SITE_OTHER): Payer: Self-pay | Admitting: Family Medicine

## 2018-09-02 DIAGNOSIS — M25512 Pain in left shoulder: Secondary | ICD-10-CM

## 2018-09-02 DIAGNOSIS — S42035A Nondisplaced fracture of lateral end of left clavicle, initial encounter for closed fracture: Secondary | ICD-10-CM

## 2018-09-02 MED ORDER — ACETAMINOPHEN-CODEINE #3 300-30 MG PO TABS: 1.0000 | ORAL_TABLET | Freq: Every evening | ORAL | 0 refills | Status: DC | PRN

## 2018-09-02 NOTE — Progress Notes (Signed)
  Makell Cyr - 42 y.o. male MRN [id number]  Date of birth: 01-22-1976    SUBJECTIVE:      Chief Complaint:/ HPI:  42 year old male here for follow-up of distal left clavicle fracture.  Date of injury was 9/14, approximately 3 weeks ago.  He reports continued significant pain in the left shoulder which is worse at night.  He continues to have difficulty achieving full range of motion of the shoulder.  He still wearing a sling the majority of the time for comfort.  When not wearing his sling he keeps arm internally rotated and flexed at the elbow.  Patient does report sensation of painful clicking around the [facility] joint/distal clavicle with movement of his arm.   ROS:     See HPI  PERTINENT  PMH / PSH FH / / SH:  Past Medical, Surgical, Social, and Family History Reviewed & Updated in the EMR.    OBJECTIVE: There were no vitals taken for this visit.  Physical Exam:  Vital signs are reviewed.  GEN: Alert and oriented, NAD Pulm: Breathing unlabored PSY: normal mood, congruent affect  MSK: Left shoulder: Inspection: No obvious deformity or step-off Palpation: Tenderness to palpation over the distal clavicle/AC joint.  No tenderness over the proximal clavicle ROM: Patient has limited range of motion in all planes of movement with the shoulder due to pain.  Slightly reduced extension at the elbow compared to the right. Strength: Decreased strength due to pain Neurovascular: N/V intact       ASSESSMENT & PLAN:  1.  Left distal clavicle fracture-patient on 3 weeks out from his injury.  X-rays obtained today show good alignment and appropriate healing.  I would expect that at this point he should be having significant improvement in his pain and able to go without his sling. Because he is still having significant pain beyond what would be expected at this point it is healing, we will order an MRI for further evaluation

## 2018-09-02 NOTE — Progress Notes (Signed)
xr

## 2018-09-02 NOTE — Progress Notes (Signed)
   Office Visit Note   Patient: Anthony Zavala           Date of Birth: 1976-04-16           MRN: [id number] Visit Date: 09/02/2018 Requested by: No referring provider defined for this encounter. PCP: Patient, No Pcp Per  Subjective: Chief Complaint  Patient presents with  . Right Shoulder - Pain, Follow-up  . Left Shoulder - Pain, Follow-up    Clavicle fracture 08/09/18.    HPI: He is about 3 weeks status post fall resulting in left distal and possibly proximal clavicle fractures.  Still complaining of severe pain, wearing his sling.  He feels like something is shifting when he tries to move his arm.  He remains out of work.              ROS: Noncontributory  Objective: Vital Signs: There were no vitals taken for this visit.  Physical Exam:  Left shoulder: Limited active range of motion, very tender at the [facility] joint and distal clavicle.  Slightly tender in the lateral subacromial space.  Imaging: Clavicle x-rays: I do not see a fracture at the proximal clavicle today.  Distal clavicle fracture appears to be healing, seems to be nondisplaced.  Coracoclavicular interval might be slightly wide but comparison views were not obtained today.  AC joint looks intact.  Assessment & Plan: 1.  3 weeks status post fall with left distal clavicle fracture, still in severe pain.  Cannot rule out rotator cuff tear or more extensive damage to the cc or AC ligaments. -MRI to rule out rotator cuff tear.  If negative, then start physical therapy. -Tylenol with codeine to use sparingly only at bedtime. -Out of work for 2 more weeks while awaiting MRI results.     Follow-Up Instructions: Return in about 2 weeks (around 09/16/2018).       Procedures: None today.   PMFS History: There are no active problems to display for this patient.  Past Medical History:  Diagnosis Date  . Asthma   . Hypertension     No family history on file.  No past surgical history on file. Social History    Occupational History  . Not on file  Tobacco Use  . Smoking status: Current Every Day Smoker    Packs/day: 0.50    Types: Cigarettes  . Smokeless tobacco: Never Used  Substance and Sexual Activity  . Alcohol use: Yes  . Drug use: No  . Sexual activity: Not on file

## 2018-09-08 ENCOUNTER — Ambulatory Visit: Payer: Self-pay | Attending: Emergency Medicine | Admitting: Physical Therapy

## 2018-09-12 ENCOUNTER — Ambulatory Visit
Admission: RE | Admit: 2018-09-12 | Discharge: 2018-09-12 | Disposition: A | Discharge status: Home / Self Care | Payer: Self-pay | Source: Ambulatory Visit | Attending: Family Medicine | Admitting: Family Medicine

## 2018-09-12 ENCOUNTER — Other Ambulatory Visit: Payer: Self-pay

## 2018-09-12 ENCOUNTER — Telehealth (INDEPENDENT_AMBULATORY_CARE_PROVIDER_SITE_OTHER): Payer: Self-pay | Admitting: Family Medicine

## 2018-09-12 DIAGNOSIS — M25512 Pain in left shoulder: Secondary | ICD-10-CM

## 2018-09-12 NOTE — Telephone Encounter (Signed)
MRI shows a full-thickness rotator cuff tear with retraction.  We will have him come in to discuss surgical options.

## 2018-09-15 NOTE — Telephone Encounter (Signed)
I tried calling patient. No answer. Unable to LM

## 2018-09-16 ENCOUNTER — Encounter (INDEPENDENT_AMBULATORY_CARE_PROVIDER_SITE_OTHER): Payer: Self-pay

## 2018-09-16 ENCOUNTER — Telehealth (INDEPENDENT_AMBULATORY_CARE_PROVIDER_SITE_OTHER): Payer: Self-pay

## 2018-09-16 NOTE — Telephone Encounter (Signed)
Patient walked into the office today concerning work note and was advised that he needed to make an appointment to see a surgeon per Dr. Prince Rome. Advised that he has a torn rotator cuff tendon.  Patient has an appointment scheduled for 09/18/2018 with Dr. Roda Shutters to discuss surgery.  Per Dr. Prince Rome, work note was extended until patient sees Dr. Roda Shutters and disability form was completed by Dr. Prince Rome.

## 2018-09-16 NOTE — Telephone Encounter (Signed)
Please see other message on this from today's date (from April J.).

## 2018-09-18 ENCOUNTER — Ambulatory Visit (INDEPENDENT_AMBULATORY_CARE_PROVIDER_SITE_OTHER): Payer: Self-pay | Admitting: Orthopaedic Surgery

## 2018-09-18 ENCOUNTER — Telehealth (INDEPENDENT_AMBULATORY_CARE_PROVIDER_SITE_OTHER): Payer: Self-pay | Admitting: Orthopaedic Surgery

## 2018-09-18 NOTE — Telephone Encounter (Signed)
Patient came in stating he thought appt was at 11:30. advised 10:30 and NO SHOW. Patient requesting updated note for work.  Please call patient to advise [phone]

## 2018-09-18 NOTE — Telephone Encounter (Signed)
Continue same work restrictions. 

## 2018-09-18 NOTE — Telephone Encounter (Signed)
Please advise. What would you like work note to say?

## 2018-09-19 NOTE — Telephone Encounter (Signed)
Attempted to reach patient again. Get message that subscriber is unavailable. Unable to leave message.

## 2018-09-19 NOTE — Telephone Encounter (Signed)
I tried to reach patient, unable to leave message. Dr. August Saucer has opened his schedule and can see him on Tuesday morning.  Work note can be extended with Dr. Prince Rome as provider only until patient is seen in the office on Tuesday.  Will try patient again.

## 2018-09-19 NOTE — Telephone Encounter (Signed)
I called patient to advise.  There is not another appointment in the system for him to be seen. He states that he was told 09/30/18, but he would like to come in sooner so that he can get the surgery scheduled. I advised that Dr. Roda Shutters is out of the office next week and that is his next available appointment. He states that he was given the name of multiple doctors and would like to know if he can see one of them so that he does not have to wait.

## 2018-09-22 NOTE — Telephone Encounter (Signed)
I called and spoke with patient. Explained that he could have restrictions until he was seen in the office. Appointment made with Dr. August Saucer tomorrow morning since Dr. Roda Shutters is out of the office and patient does not wish to wait.

## 2018-09-23 ENCOUNTER — Ambulatory Visit (INDEPENDENT_AMBULATORY_CARE_PROVIDER_SITE_OTHER): Payer: Self-pay | Admitting: Orthopedic Surgery

## 2018-09-23 ENCOUNTER — Encounter (INDEPENDENT_AMBULATORY_CARE_PROVIDER_SITE_OTHER): Payer: Self-pay | Admitting: Orthopedic Surgery

## 2018-09-23 DIAGNOSIS — S46012D Strain of muscle(s) and tendon(s) of the rotator cuff of left shoulder, subsequent encounter: Secondary | ICD-10-CM

## 2018-09-23 MED ORDER — TRAMADOL HCL 50 MG PO TABS: ORAL_TABLET | ORAL | 0 refills | Status: DC

## 2018-09-23 NOTE — Progress Notes (Signed)
Office Visit Note   Patient: Anthony Zavala           Date of Birth: [birth year]           MRN: [id number] Visit Date: 09/23/2018 Requested by: No referring provider defined for this encounter. PCP: Patient, No Pcp Per  Subjective: Chief Complaint  Patient presents with  . Left Shoulder - Pain    DOI 08/09/18 - s/p MRI - discuss surgical/treatment options    HPI: Anthony Zavala is a patient with left shoulder pain.  Date of injury 08/09/2018.  He works as a Counselling psychologist.  He has not worked since the injury.  Describes left shoulder pain.  He does drink occasionally and describes being a daily smoker.  He reports weakness and pain in that left shoulder.  MRI scan has been performed which shows rotator cuff tear as well as posttraumatic deformity of the distal clavicle.  There is also a ganglion cyst at the myotendinous junction.              ROS: All systems reviewed are negative as they relate to the chief complaint within the history of present illness.  Patient denies  fevers or chills.   Assessment & Plan: Visit Diagnoses:  1. Traumatic complete tear of left rotator cuff, subsequent encounter     Plan: Impression is rotator cuff tear left shoulder with muscular tendinous junction cyst.  Plan is arthroscopy subacromial decompression rotator cuff repair possible biceps tenodesis.  Risk and benefits are discussed with the patient including but not limited to infection nerve vessel damage incomplete pain relief and potential re-tear especially in the type of work he is doing.  Anticipate he would be out of physical work for at least 3 months.  All questions answered.  He does have evidence of prior coracoclavicular ligament injury but this appears to have healed.  There is some calcification of the coracoclavicular ligaments as well as posttraumatic deformity of the AC joint.  This does not appear to be particularly tender at this time.  I would not advocate for any type of distal clavicle procedure  based on the alignment at this time.  I think most if not all of his symptoms are coming from this rotator cuff tear.  Follow-Up Instructions: No follow-ups on file.   Orders:  No orders of the defined types were placed in this encounter.  No orders of the defined types were placed in this encounter.     Procedures: No procedures performed   Clinical Data: No additional findings.  Objective: Vital Signs: There were no vitals taken for this visit.  Physical Exam:   Constitutional: Patient appears well-developed HEENT:  Head: Normocephalic Eyes:EOM are normal Neck: Normal range of motion Cardiovascular: Normal rate Pulmonary/chest: Effort normal Neurologic: Patient is alert Skin: Skin is warm Psychiatric: Patient has normal mood and affect    Ortho Exam: Ortho exam demonstrates painful range of motion of that left shoulder but no discrete AC joint tenderness.  Does have some weakness to supraspinatus testing on the left.  No evidence of frozen shoulder.  Motor or sensory function to the hand is intact.  There is coarseness and grinding with passive range of motion of that left shoulder.  Specialty Comments:  No specialty comments available.  Imaging: No results found.   PMFS History: There are no active problems to display for this patient.  Past Medical History:  Diagnosis Date  . Asthma   . Hypertension     History  reviewed. No pertinent family history.  History reviewed. No pertinent surgical history. Social History   Occupational History  . Not on file  Tobacco Use  . Smoking status: Current Every Day Smoker    Packs/day: 0.50    Types: Cigarettes  . Smokeless tobacco: Never Used  Substance and Sexual Activity  . Alcohol use: Yes  . Drug use: No  . Sexual activity: Not on file

## 2018-10-29 ENCOUNTER — Telehealth (INDEPENDENT_AMBULATORY_CARE_PROVIDER_SITE_OTHER): Payer: Self-pay | Admitting: Orthopedic Surgery

## 2018-10-29 NOTE — Telephone Encounter (Signed)
Patient is scheduled for left shoulder arthroscopy at [facility] on 11-13-18.  He is requesting something for pain to hold him over until the surgery.  He states that the Tramadol prescribed does nothing for him and prefers something else.  Patient's pharmacy is [facility] [address]    Pt's cb  [phone]

## 2018-10-30 NOTE — Telephone Encounter (Signed)
Please advise 

## 2018-11-03 NOTE — Telephone Encounter (Signed)
Okay for Norco 03/28/2024 1 p.o. every 12 hours as needed pain #20 with no refills thanks

## 2018-11-04 MED ORDER — HYDROCODONE-ACETAMINOPHEN 5-325 MG PO TABS: 1.0000 | ORAL_TABLET | Freq: Two times a day (BID) | ORAL | 0 refills | Status: DC | PRN

## 2018-11-04 NOTE — Telephone Encounter (Signed)
I will do it...Marland Kitchen.Marland Kitchen.this one time.

## 2018-11-04 NOTE — Telephone Encounter (Signed)
Can you send this into the pharmacy since Dean isn't here?

## 2018-11-07 ENCOUNTER — Other Ambulatory Visit (INDEPENDENT_AMBULATORY_CARE_PROVIDER_SITE_OTHER): Payer: Self-pay | Admitting: Orthopedic Surgery

## 2018-11-07 DIAGNOSIS — S46012D Strain of muscle(s) and tendon(s) of the rotator cuff of left shoulder, subsequent encounter: Secondary | ICD-10-CM

## 2018-11-10 ENCOUNTER — Other Ambulatory Visit: Payer: Self-pay

## 2018-11-10 ENCOUNTER — Encounter (HOSPITAL_COMMUNITY): Payer: Self-pay | Admitting: Physician Assistant

## 2018-11-10 ENCOUNTER — Encounter (HOSPITAL_COMMUNITY): Payer: Self-pay

## 2018-11-10 ENCOUNTER — Encounter (HOSPITAL_COMMUNITY)
Admission: RE | Admit: 2018-11-10 | Discharge: 2018-11-10 | Disposition: A | Discharge status: Home / Self Care | Payer: Self-pay | Source: Ambulatory Visit | Attending: Orthopedic Surgery | Admitting: Orthopedic Surgery

## 2018-11-10 ENCOUNTER — Telehealth (INDEPENDENT_AMBULATORY_CARE_PROVIDER_SITE_OTHER): Payer: Self-pay | Admitting: Orthopedic Surgery

## 2018-11-10 DIAGNOSIS — I10 Essential (primary) hypertension: Secondary | ICD-10-CM | POA: Insufficient documentation

## 2018-11-10 DIAGNOSIS — Z01818 Encounter for other preprocedural examination: Secondary | ICD-10-CM | POA: Insufficient documentation

## 2018-11-10 LAB — CBC
HCT: 43.2 % (ref 39.0–52.0)
Hemoglobin: 14.1 g/dL (ref 13.0–17.0)
MCH: 30.9 pg (ref 26.0–34.0)
MCHC: 32.6 g/dL (ref 30.0–36.0)
MCV: 94.7 fL (ref 80.0–100.0)
Platelets: 177 10*3/uL (ref 150–400)
RBC: 4.56 MIL/uL (ref 4.22–5.81)
RDW: 12.5 % (ref 11.5–15.5)
WBC: 4.9 10*3/uL (ref 4.0–10.5)
nRBC: 0 % (ref 0.0–0.2)

## 2018-11-10 LAB — BASIC METABOLIC PANEL
Anion gap: 12 (ref 5–15)
BUN: 6 mg/dL (ref 6–20)
CALCIUM: 9.6 mg/dL (ref 8.9–10.3)
CO2: 27 mmol/L (ref 22–32)
Chloride: 99 mmol/L (ref 98–111)
Creatinine, Ser: 0.97 mg/dL (ref 0.61–1.24)
GFR calc Af Amer: >60 mL/min (ref >60)
GFR calc non Af Amer: >60 mL/min (ref >60)
Glucose, Bld: 118 mg/dL — ABNORMAL HIGH (ref 70–99)
Potassium: 3.7 mmol/L (ref 3.5–5.1)
Sodium: 138 mmol/L (ref 135–145)

## 2018-11-10 NOTE — Pre-Procedure Instructions (Signed)
Anthony Zavala  11/10/2018      [id number] - [place] - [phone] [facility] [facility] [address] [place] [id number] Phone: [phone] Fax: [phone]    Your procedure is scheduled on December 19  Report to [facility] Admitting at [address] A.M.  Call this number if you have problems the morning of surgery:  [phone]   Remember:  Do not eat or drink after midnight.      Take these medicines the morning of surgery with A SIP OF WATER  albuterol (PROVENTIL HFA;VENTOLIN HFA)  Inhaler bring with you the morning of surgery HYDROcodone-acetaminophen (NORCO/VICODIN)  If needed for surgery  7 days prior to surgery STOP taking any Aspirin(unless otherwise instructed by your surgeon), Aleve, Naproxen, Ibuprofen, Motrin, Advil, Goody's, BC's, all herbal medications, fish oil, and all vitamins     Do not wear jewelry  Do not wear lotions, powders, or cologne, or deodorant.  Men may shave face and neck.  Do not bring valuables to the hospital.  [facility] is not responsible for any belongings or valuables.  Contacts, dentures or bridgework may not be worn into surgery.  Leave your suitcase in the car.  After surgery it may be brought to your room.  For patients admitted to the hospital, discharge time will be determined by your treatment team.  Patients discharged the day of surgery will not be allowed to drive home.    Special instructions:   [place]- Preparing For Surgery  Before surgery, you can play an important role. Because skin is not sterile, your skin needs to be as free of germs as possible. You can reduce the number of germs on your skin by washing with CHG (chlorahexidine gluconate) Soap before surgery.  CHG is an antiseptic cleaner which kills germs and bonds with the skin to continue killing germs even after washing.    Oral Hygiene is also important to reduce your risk of infection.   Remember - BRUSH YOUR TEETH THE MORNING OF SURGERY WITH YOUR REGULAR TOOTHPASTE  Please do not use if you have an allergy to CHG or antibacterial soaps. If your skin becomes reddened/irritated stop using the CHG.  Do not shave (including legs and underarms) for at least 48 hours prior to first CHG shower. It is OK to shave your face.  Please follow these instructions carefully.   1. Shower the NIGHT BEFORE SURGERY and the MORNING OF SURGERY with CHG.   2. If you chose to wash your hair, wash your hair first as usual with your normal shampoo.  3. After you shampoo, rinse your hair and body thoroughly to remove the shampoo.  4. Use CHG as you would any other liquid soap. You can apply CHG directly to the skin and wash gently with a scrungie or a clean washcloth.   5. Apply the CHG Soap to your body ONLY FROM THE NECK DOWN.  Do not use on open wounds or open sores. Avoid contact with your eyes, ears, mouth and genitals (private parts). Wash Face and genitals (private parts)  with your normal soap.  6. Wash thoroughly, paying special attention to the area where your surgery will be performed.  7. Thoroughly rinse your body with warm water from the neck down.  8. DO NOT shower/wash with your normal soap after using and rinsing off the CHG Soap.  9. Pat yourself dry with a CLEAN TOWEL.  10. Wear CLEAN  PAJAMAS to bed the night before surgery, wear comfortable clothes the morning of surgery  11. Place CLEAN SHEETS on your bed the night of your first shower and DO NOT SLEEP WITH PETS.    Day of Surgery:  Do not apply any deodorants/lotions.  Please wear clean clothes to the hospital/surgery center.   Remember to brush your teeth WITH YOUR REGULAR TOOTHPASTE.    Please read over the following fact sheets that you were given.

## 2018-11-10 NOTE — Telephone Encounter (Signed)
Anthony Zavala with [facility] called to say patient was seen today. Patient is on for left shoulder arthroscopy for 11/13/18.  BP was high. Taken 3 times. Diastolic best was 98 and worst 133. Systolic was up to 180.  The patient is not on any medication and is not established with PCP. Patient is going to try and get in at [facility] today, but with BP this high and lack of treatment, could be a cause for cancellation.

## 2018-11-10 NOTE — Progress Notes (Signed)
PCP - doesn't have one Cardiologist - denies  Chest x-ray - not needed EKG - 11/10/18 Stress Test - denies ECHO - denies Cardiac Cath - denies   Anesthesia review: yes, elevated blood pressure at PAT appointment.  Patient stated that he has been seen in the past for elevated blood pressure but was never treated.  Patients pressure 161/98 upon arrival to short stay.  Second pressure check is  EKG performed due to elevated pressure with a history of high pressures Educated patient that is is important that he find a PCP to help with pressure management  Fayrene FearingJames PA came to see patient in pre-asmission due to pressure recheck still being elevated   Patient denies shortness of breath, fever, cough and chest pain at PAT appointment   Patient verbalized understanding of instructions that were given to them at the PAT appointment. Patient was also instructed that they will need to review over the PAT instructions again at home before surgery.

## 2018-11-10 NOTE — Telephone Encounter (Signed)
I would say at this point we should just go ahead and cancel him.  We should aim for putting something else in that time slot or moving everyone up.  Thanks

## 2018-11-11 NOTE — Progress Notes (Signed)
Anesthesia Chart Review:  Case:  [id number] Date/Time:  11/13/18 1015   Procedure:  LEFT SHOULDER ARTHROSCOPY, SUBACROMIAL DECOMPRESSION, POSSIBLE BICEPS TENODESIS, MINI OPEN ROTATOR CUFF TEAR REPAIR (Left )   Anesthesia type:  General   Pre-op diagnosis:  left shoulder rotator cuff tear   Location:  [facility] OR ROOM 12 / [facility] OR   Surgeon:  Cammy Copaean, Gregory Scott, MD      DISCUSSION: 42 yo male current smoker. Pertinent hx includes asthma and HTN.  I was called to see pt during PAT appt for uncontrolled HTN. BP taken 3 times, diastolic ranged from 98-133 and systolic 154-180. He reports previously being treated for HTN, however he is not currently taking any medication, is not established with a PCP. Discussed anesthesia concerns of uncontrolled HTN as well as general medical concerns. Advised pt that his best bet was to try to be seen at local urgent/primary care office and he was provided with options. He said he would try to be seen same day at [facility]. He understands that if his BP is uncontrolled on DOS it could be cause for cancellation.  I called Debbie at Dr. Diamantina Providenceean's office to let her know about pt's HTN. She will discuss with Dr. August Saucerean and f/u with pt.   VS: BP (!) 154/118   Pulse 83   Temp 36.8 C   Resp 20   Ht 5\' 6"  (1.676 m)   Wt 65.2 kg   SpO2 100%   BMI 23.21 kg/m   PROVIDERS: Patient, No Pcp Per   LABS: Labs reviewed: Acceptable for surgery. (all labs ordered are listed, but only abnormal results are displayed)  Labs Reviewed  BASIC METABOLIC PANEL - Abnormal; Notable for the following components:      Result Value   Glucose, Bld 118 (*)    All other components within normal limits  CBC    EKG: 11/10/2018: Normal sinus rhythm. T wave abnormality, consider inferior ischemia  CV: N/A  Past Medical History:  Diagnosis Date  . Asthma   . Hypertension     History reviewed. No pertinent surgical history.  MEDICATIONS: . albuterol (PROVENTIL HFA;VENTOLIN  HFA) 108 (90 Base) MCG/ACT inhaler  . albuterol (PROVENTIL) (2.5 MG/3ML) 0.083% nebulizer solution  . HYDROcodone-acetaminophen (NORCO/VICODIN) 5-325 MG tablet   No current facility-administered medications for this encounter.       Zannie CoveJames Neythan Kozlov, PA-C [facility]/Anesthesiology Phone [phone] 11/11/2018 11:05 AM

## 2018-11-13 ENCOUNTER — Ambulatory Visit (HOSPITAL_COMMUNITY): Admission: RE | Admit: 2018-11-13 | Payer: Self-pay | Source: Ambulatory Visit | Admitting: Orthopedic Surgery

## 2018-11-13 ENCOUNTER — Encounter (HOSPITAL_COMMUNITY): Admission: RE | Payer: Self-pay | Source: Ambulatory Visit

## 2018-11-13 ENCOUNTER — Telehealth (INDEPENDENT_AMBULATORY_CARE_PROVIDER_SITE_OTHER): Payer: Self-pay | Admitting: Orthopedic Surgery

## 2018-11-13 SURGERY — SHOULDER ARTHROSCOPY WITH ROTATOR CUFF REPAIR AND SUBACROMIAL DECOMPRESSION
Anesthesia: General | Laterality: Left

## 2018-11-13 NOTE — Telephone Encounter (Signed)
Called patient yesterday to let him know that surgery for Dec 19th had been cancelled due to elevated BP.  Patient stated that he was put on BP medicine this week.  Patient understands that he will need to follow up with PCP and clearance will be necessary in order to scheduled the surgery. He states he could not remember the name of the doctor, nor the exact date in which he is to follow up but will call back and provide the information

## 2018-11-27 ENCOUNTER — Ambulatory Visit (INDEPENDENT_AMBULATORY_CARE_PROVIDER_SITE_OTHER): Payer: Self-pay | Admitting: Orthopedic Surgery

## 2018-11-27 ENCOUNTER — Encounter (INDEPENDENT_AMBULATORY_CARE_PROVIDER_SITE_OTHER): Payer: Self-pay | Admitting: Orthopedic Surgery

## 2018-11-27 VITALS — BP 157/114 | HR 96

## 2018-11-27 DIAGNOSIS — S46012D Strain of muscle(s) and tendon(s) of the rotator cuff of left shoulder, subsequent encounter: Secondary | ICD-10-CM

## 2018-11-27 MED ORDER — HYDROCODONE-ACETAMINOPHEN 5-325 MG PO TABS: 1.0000 | ORAL_TABLET | Freq: Every day | ORAL | 0 refills | Status: DC | PRN

## 2018-11-27 NOTE — Progress Notes (Signed)
   Office Visit Note   Patient: Anthony Zavala           Date of Birth: 08-Mar-1976           MRN: [id number] Visit Date: 11/27/2018 Requested by: No referring provider defined for this encounter. PCP: Patient, No Pcp Per  Subjective: Chief Complaint  Patient presents with  . Left Shoulder - Follow-up    HPI: Anthony Zavala is a patient with left shoulder rotator cuff tear.  Injured it moving furniture several months ago.  He was scheduled for surgery but blood pressure was too high.  He has been taking blood pressure medication.  He is out of pain medicine.              ROS: All systems reviewed are negative as they relate to the chief complaint within the history of present illness.  Patient denies  fevers or chills.   Assessment & Plan: Visit Diagnoses: No diagnosis found.  Plan: Impression is left shoulder pain with rotator cuff tear.  Passive range of motion is maintained.  Pain medicine refilled today but he is given stopped taking it about a week before surgery.  Needs to continue taking pain medicine for 1 month total prior to surgery.  In general his pressure is lower but he is encouraged to take the pain medicine every day.  I think we can schedule the surgery for about 3 weeks from now.  Pain medicine is represcribed today to be taken if Tylenol is not helpful on its own.  Follow-Up Instructions: No follow-ups on file.   Orders:  No orders of the defined types were placed in this encounter.  No orders of the defined types were placed in this encounter.     Procedures: No procedures performed   Clinical Data: No additional findings.  Objective: Vital Signs: BP (!) 157/114   Pulse 96   Physical Exam:   Constitutional: Patient appears well-developed HEENT:  Head: Normocephalic Eyes:EOM are normal Neck: Normal range of motion Cardiovascular: Normal rate Pulmonary/chest: Effort normal Neurologic: Patient is alert Skin: Skin is warm Psychiatric: Patient has normal  mood and affect    Ortho Exam: Ortho exam demonstrates fairly well-maintained passive range of motion of that left shoulder but with some coarse grinding and crepitus with active and passive range of motion at 90 degrees of abduction.  Still has some supraspinatus infraspinatus weakness to isolated cuff testing.  No Popeye deformity today.  Specialty Comments:  No specialty comments available.  Imaging: No results found.   PMFS History: There are no active problems to display for this patient.  Past Medical History:  Diagnosis Date  . Asthma   . Hypertension     History reviewed. No pertinent family history.  History reviewed. No pertinent surgical history. Social History   Occupational History  . Not on file  Tobacco Use  . Smoking status: Current Every Day Smoker    Packs/day: 0.25    Types: Cigarettes  . Smokeless tobacco: Never Used  Substance and Sexual Activity  . Alcohol use: Yes    Comment: socially  . Drug use: No  . Sexual activity: Not on file

## 2018-11-27 NOTE — Addendum Note (Signed)
Addended by: Rip Harbour on: 11/27/2018 01:39 PM   Modules accepted: Orders

## 2018-12-02 ENCOUNTER — Ambulatory Visit (INDEPENDENT_AMBULATORY_CARE_PROVIDER_SITE_OTHER): Payer: Self-pay | Admitting: Family Medicine

## 2018-12-02 ENCOUNTER — Telehealth: Payer: Self-pay | Admitting: Family Medicine

## 2018-12-02 ENCOUNTER — Encounter: Payer: Self-pay | Admitting: Family Medicine

## 2018-12-02 VITALS — BP 144/103 | HR 101 | Resp 17 | Ht 66.0 in | Wt 144.4 lb

## 2018-12-02 DIAGNOSIS — Z7689 Persons encountering health services in other specified circumstances: Secondary | ICD-10-CM

## 2018-12-02 DIAGNOSIS — R9431 Abnormal electrocardiogram [ECG] [EKG]: Secondary | ICD-10-CM

## 2018-12-02 DIAGNOSIS — I10 Essential (primary) hypertension: Secondary | ICD-10-CM

## 2018-12-02 MED ORDER — LISINOPRIL 20 MG PO TABS: 20.0000 mg | ORAL_TABLET | Freq: Every day | ORAL | 1 refills | Status: DC

## 2018-12-02 NOTE — Telephone Encounter (Signed)
Please fax over copy clearance letter and EKG to Dr. Jens Som and Dr.Gregory August Saucer

## 2018-12-02 NOTE — Telephone Encounter (Signed)
Clearance letter faxed to Dr. August Saucer & Dr. Ludwig Clarks offices.

## 2018-12-02 NOTE — Progress Notes (Signed)
  Subjective:    Anthony Zavala is a 43 y.o. male who presents for evaluation of elevated blood pressures. No prior history or knowledge of elevated blood pressure readings. Patient was found to be hypertensive and referred here for management of hypertension and surgical clearance to proceed  Left shoulder arthroscopy.  Patient is under the care of Dr. Rise Paganini.  He denies any chest pain, shortness of breath, edema, new weakness. Patient endorses occasional headaches although never treated headaches to high blood pressure.  Currently not prescribed any chronic medications.  No recent preventative medical care over the last 2 to 3 years.  Review of Systems Pertinent items are noted in HPI.  Objective:    BP (!) 144/103   Pulse (!) 101   Resp 17   Ht 5\' 6"  (1.676 m)   Wt 144 lb 6.4 oz (65.5 kg)   SpO2 97%   BMI 23.31 kg/m   General Appearance:    Alert, cooperative, no distress, appears stated age  Head:    Normocephalic, without obvious abnormality, atraumatic  Eyes:    PERRL, conjunctiva/corneas clear, EOM's intact, fundi    benign, both eyes       Lungs:     Clear to auscultation bilaterally, respirations unlabored  Heart:    Regular rate and rhythm, S1 and S2 normal, no murmur, rub   or gallop  Extremities:   Extremities normal, atraumatic, no cyanosis or edema  Pulses:   2+ and symmetric all extremities  Skin:   Skin color, texture, turgor normal, no rashes or lesions  Neurologic:   CNII-XII intact. Normal strength, sensation and reflexes      throughout    Cardiographics ECG: Sinus Rhythm, BPM 112, non-specific ST depression and T abnormalities  Left Atrial enlargement    Assessment and Plan:  1. Encounter to establish care 2. Essential hypertension, Uncontrolled, New Diagnosis  Start lisinopril 20 mg once daily.  Renal function stable. - EKG 12-Lead, abnormal. Will clear medically for surgery, however, patient will require cardiovascular clearance given findings on  ECG. Will check a Thyroid panel. Patient has had a recent BMP and CBC , which were unremarkable  - Ambulatory referral to Cardiology - Thyroid Panel With TSH  3. Nonspecific ST-T wave electrocardiographic changes  Referred to cardiology for further patient given newly diagnosed with hypertension unknown of length of time patient has remained untreated.  EKG also showed some left atrial enlargement which is likely secondary to hypertension.   Orders Placed This Encounter  Procedures  . Thyroid Panel With TSH  . Ambulatory referral to Cardiology  . EKG 12-Lead   Meds ordered this encounter  Medications  . lisinopril (PRINIVIL,ZESTRIL) 20 MG tablet    Sig: Take 1 tablet (20 mg total) by mouth daily.    Dispense:  30 tablet    Refill:  1    Joaquin Courts, FNP Primary Care at [facility] [address], [place] [address] [phone]: [phone]

## 2018-12-02 NOTE — Patient Instructions (Addendum)
I am starting you Lisinopril 20 mg once daily for blood pressure control. You will need to return for follow-up after your surgery.  You have been cleared medically for surgery however, will require cardiology clearance given abnormal EKG.        Hypertension Hypertension, commonly called high blood pressure, is when the force of blood pumping through the arteries is too strong. The arteries are the blood vessels that carry blood from the heart throughout the body. Hypertension forces the heart to work harder to pump blood and may cause arteries to become narrow or stiff. Having untreated or uncontrolled hypertension can cause heart attacks, strokes, kidney disease, and other problems. A blood pressure reading consists of a higher number over a lower number. Ideally, your blood pressure should be below 120/80. The first ("top") number is called the systolic pressure. It is a measure of the pressure in your arteries as your heart beats. The second ("bottom") number is called the diastolic pressure. It is a measure of the pressure in your arteries as the heart relaxes. What are the causes? The cause of this condition is not known. What increases the risk? Some risk factors for high blood pressure are under your control. Others are not. Factors you can change  Smoking.  Having type 2 diabetes mellitus, high cholesterol, or both.  Not getting enough exercise or physical activity.  Being overweight.  Having too much fat, sugar, calories, or salt (sodium) in your diet.  Drinking too much alcohol. Factors that are difficult or impossible to change  Having chronic kidney disease.  Having a family history of high blood pressure.  Age. Risk increases with age.  Race. You may be at higher risk if you are African-American.  Gender. Men are at higher risk than women before age 43. After age 43, women are at higher risk than men.  Having obstructive sleep apnea.  Stress. What are the  signs or symptoms? Extremely high blood pressure (hypertensive crisis) may cause:  Headache.  Anxiety.  Shortness of breath.  Nosebleed.  Nausea and vomiting.  Severe chest pain.  Jerky movements you cannot control (seizures). How is this diagnosed? This condition is diagnosed by measuring your blood pressure while you are seated, with your arm resting on a surface. The cuff of the blood pressure monitor will be placed directly against the skin of your upper arm at the level of your heart. It should be measured at least twice using the same arm. Certain conditions can cause a difference in blood pressure between your right and left arms. Certain factors can cause blood pressure readings to be lower or higher than normal (elevated) for a short period of time:  When your blood pressure is higher when you are in a health care provider's office than when you are at home, this is called white coat hypertension. Most people with this condition do not need medicines.  When your blood pressure is higher at home than when you are in a health care provider's office, this is called masked hypertension. Most people with this condition may need medicines to control blood pressure. If you have a high blood pressure reading during one visit or you have normal blood pressure with other risk factors:  You may be asked to return on a different day to have your blood pressure checked again.  You may be asked to monitor your blood pressure at home for 1 week or longer. If you are diagnosed with hypertension, you may have other  blood or imaging tests to help your health care provider understand your overall risk for other conditions. How is this treated? This condition is treated by making healthy lifestyle changes, such as eating healthy foods, exercising more, and reducing your alcohol intake. Your health care provider may prescribe medicine if lifestyle changes are not enough to get your blood pressure  under control, and if:  Your systolic blood pressure is above 130.  Your diastolic blood pressure is above 80. Your personal target blood pressure may vary depending on your medical conditions, your age, and other factors. Follow these instructions at home: Eating and drinking   Eat a diet that is high in fiber and potassium, and low in sodium, added sugar, and fat. An example eating plan is called the DASH (Dietary Approaches to Stop Hypertension) diet. To eat this way: ? Eat plenty of fresh fruits and vegetables. Try to fill half of your plate at each meal with fruits and vegetables. ? Eat whole grains, such as whole wheat pasta, brown rice, or whole grain bread. Fill about one quarter of your plate with whole grains. ? Eat or drink low-fat dairy products, such as skim milk or low-fat yogurt. ? Avoid fatty cuts of meat, processed or cured meats, and poultry with skin. Fill about one quarter of your plate with lean proteins, such as fish, chicken without skin, beans, eggs, and tofu. ? Avoid premade and processed foods. These tend to be higher in sodium, added sugar, and fat.  Reduce your daily sodium intake. Most people with hypertension should eat less than 1,500 mg of sodium a day.  Limit alcohol intake to no more than 1 drink a day for nonpregnant women and 2 drinks a day for men. One drink equals 12 oz of beer, 5 oz of wine, or 1 oz of hard liquor. Lifestyle   Work with your health care provider to maintain a healthy body weight or to lose weight. Ask what an ideal weight is for you.  Get at least 30 minutes of exercise that causes your heart to beat faster (aerobic exercise) most days of the week. Activities may include walking, swimming, or biking.  Include exercise to strengthen your muscles (resistance exercise), such as pilates or lifting weights, as part of your weekly exercise routine. Try to do these types of exercises for 30 minutes at least 3 days a week.  Do not use any  products that contain nicotine or tobacco, such as cigarettes and e-cigarettes. If you need help quitting, ask your health care provider.  Monitor your blood pressure at home as told by your health care provider.  Keep all follow-up visits as told by your health care provider. This is important. Medicines  Take over-the-counter and prescription medicines only as told by your health care provider. Follow directions carefully. Blood pressure medicines must be taken as prescribed.  Do not skip doses of blood pressure medicine. Doing this puts you at risk for problems and can make the medicine less effective.  Ask your health care provider about side effects or reactions to medicines that you should watch for. Contact a health care provider if:  You think you are having a reaction to a medicine you are taking.  You have headaches that keep coming back (recurring).  You feel dizzy.  You have swelling in your ankles.  You have trouble with your vision. Get help right away if:  You develop a severe headache or confusion.  You have unusual weakness or numbness.  You feel faint.  You have severe pain in your chest or abdomen.  You vomit repeatedly.  You have trouble breathing. Summary  Hypertension is when the force of blood pumping through your arteries is too strong. If this condition is not controlled, it may put you at risk for serious complications.  Your personal target blood pressure may vary depending on your medical conditions, your age, and other factors. For most people, a normal blood pressure is less than 120/80.  Hypertension is treated with lifestyle changes, medicines, or a combination of both. Lifestyle changes include weight loss, eating a healthy, low-sodium diet, exercising more, and limiting alcohol. This information is not intended to replace advice given to you by your health care provider. Make sure you discuss any questions you have with your health care  provider. Document Released: 11/12/2005 Document Revised: 10/10/2016 Document Reviewed: 10/10/2016 Elsevier Interactive Patient Education  2019 [employer].

## 2018-12-03 ENCOUNTER — Telehealth: Payer: Self-pay | Admitting: Cardiology

## 2018-12-03 LAB — THYROID PANEL WITH TSH
Free Thyroxine Index: 2.8 (ref 1.2–4.9)
T3 Uptake Ratio: 29 % (ref 24–39)
T4, Total: 9.8 ug/dL (ref 4.5–12.0)
TSH: 1.38 u[IU]/mL (ref 0.450–4.500)

## 2018-12-03 NOTE — Telephone Encounter (Signed)
[facility], Appt 12/08/18 @ 2:00PM. NV

## 2018-12-05 DIAGNOSIS — I10 Essential (primary) hypertension: Secondary | ICD-10-CM | POA: Insufficient documentation

## 2018-12-05 DIAGNOSIS — R9431 Abnormal electrocardiogram [ECG] [EKG]: Secondary | ICD-10-CM | POA: Insufficient documentation

## 2018-12-08 ENCOUNTER — Ambulatory Visit (INDEPENDENT_AMBULATORY_CARE_PROVIDER_SITE_OTHER): Payer: Self-pay | Admitting: Cardiology

## 2018-12-08 ENCOUNTER — Emergency Department (HOSPITAL_COMMUNITY)
Admission: EM | Admit: 2018-12-08 | Discharge: 2018-12-08 | Disposition: A | Discharge status: Home / Self Care | Payer: Self-pay | Attending: Emergency Medicine | Admitting: Emergency Medicine

## 2018-12-08 ENCOUNTER — Encounter: Payer: Self-pay | Admitting: Cardiology

## 2018-12-08 VITALS — BP 122/98 | HR 104 | Ht 66.0 in | Wt 150.0 lb

## 2018-12-08 DIAGNOSIS — I10 Essential (primary) hypertension: Secondary | ICD-10-CM

## 2018-12-08 DIAGNOSIS — R9431 Abnormal electrocardiogram [ECG] [EKG]: Secondary | ICD-10-CM

## 2018-12-08 DIAGNOSIS — Z720 Tobacco use: Secondary | ICD-10-CM

## 2018-12-08 DIAGNOSIS — K1379 Other lesions of oral mucosa: Secondary | ICD-10-CM | POA: Insufficient documentation

## 2018-12-08 DIAGNOSIS — Z5321 Procedure and treatment not carried out due to patient leaving prior to being seen by health care provider: Secondary | ICD-10-CM | POA: Insufficient documentation

## 2018-12-08 DIAGNOSIS — R600 Localized edema: Secondary | ICD-10-CM | POA: Insufficient documentation

## 2018-12-08 DIAGNOSIS — Z01810 Encounter for preprocedural cardiovascular examination: Secondary | ICD-10-CM

## 2018-12-08 MED ORDER — AMLODIPINE BESYLATE 5 MG PO TABS: 5.0000 mg | ORAL_TABLET | Freq: Every day | ORAL | 3 refills | Status: DC

## 2018-12-08 NOTE — Progress Notes (Signed)
Anthony Caprio, FNP Reason for referral-preoperative evaluation/abnormal electrocardiogram  HPI: 43 year old male for preoperative evaluation prior to shoulder surgery and abnormal electrocardiogram at request of Joaquin Courts, FNP.  Patient denies dyspnea on exertion, orthopnea, PND, pedal edema, palpitations, syncope or chest pain.  Found to have an abnormal electrocardiogram recently and cardiology asked to evaluate.  Note he was started on lisinopril recently for hypertension.  He took a dose at 10:00 this morning.  Since then he has had swelling in his left lower lip.  There is been no tongue swelling, throat swelling, wheezing or dyspnea.  Current Outpatient Medications  Medication Sig Dispense Refill  . lisinopril (PRINIVIL,ZESTRIL) 20 MG tablet Take 1 tablet (20 mg total) by mouth daily. 30 tablet 1   No current facility-administered medications for this visit.     No Known Allergies   Past Medical History:  Diagnosis Date  . Asthma   . Hypertension     Past Surgical History:  Procedure Laterality Date  . No prior surgery      Social History   Socioeconomic History  . Marital status: Single    Spouse name: Not on file  . Number of children: 7  . Years of education: Not on file  . Highest education level: Not on file  Occupational History  . Not on file  Social Needs  . Financial resource strain: Not on file  . Food insecurity:    Worry: Not on file    Inability: Not on file  . Transportation needs:    Medical: Not on file    Non-medical: Not on file  Tobacco Use  . Smoking status: Current Every Day Smoker    Packs/day: 0.25    Types: Cigarettes  . Smokeless tobacco: Never Used  Substance and Sexual Activity  . Alcohol use: Yes    Comment: socially  . Drug use: No  . Sexual activity: Not on file  Lifestyle  . Physical activity:    Days per week: Not on file    Minutes per session: Not on file  . Stress: Not on file  Relationships    . Social connections:    Talks on phone: Not on file    Gets together: Not on file    Attends religious service: Not on file    Active member of club or organization: Not on file    Attends meetings of clubs or organizations: Not on file    Relationship status: Not on file  . Intimate partner violence:    Fear of current or ex partner: Not on file    Emotionally abused: Not on file    Physically abused: Not on file    Forced sexual activity: Not on file  Other Topics Concern  . Not on file  Social History Narrative  . Not on file    Family History  Problem Relation Age of Onset  . Hypertension Mother     ROS: no fevers or chills, productive cough, hemoptysis, dysphasia, odynophagia, melena, hematochezia, dysuria, hematuria, rash, seizure activity, orthopnea, PND, pedal edema, claudication. Remaining systems are negative.  Physical Exam:   Blood pressure (!) 122/98, pulse (!) 104, height 5\' 6"  (1.676 m), weight 150 lb (68 kg).  General:  Well developed/well nourished in NAD Skin warm/dry Patient not depressed No peripheral clubbing Back-normal HEENT-normal eyelids, edema left lower lip, no tongue swelling Neck supple/normal carotid upstroke bilaterally; no bruits; no JVD; no thyromegaly chest - CTA/ normal expansion; no wheezing CV -  RRR/normal S1 and S2; no murmurs, rubs or gallops;  PMI nondisplaced Abdomen -NT/ND, no HSM, no mass, + bowel sounds, no bruit 2+ femoral pulses, no bruits Ext-no edema, chords, 2+ DP Neuro-grossly nonfocal  ECG -December 02, 2018-sinus rhythm, RV conduction delay, left atrial enlargement, inferior lateral T wave inversion.  Personally reviewed  A/P  1 preoperative evaluation prior to shoulder surgery-patient's electrocardiogram as outlined above.  He has no cardiac symptoms with excellent functional capacity.  I will arrange an echocardiogram to assess LV function.  If normal he does not require further preoperative evaluation and may  proceed.  2 abnormal electrocardiogram-question changes related to left ventricular hypertrophy.  Echocardiogram to assess.  3 hypertension-patient appears to be having angioedema/lip swelling from lisinopril.  I have asked him to discontinue this and list as an allergy.  He is not having tongue swelling or throat swelling and there is no dyspnea.  No wheezing on examination.  I have provided Benadryl 25 mg x 1 in the office.  If he starts to develop dyspnea or tongue swelling I have asked him to be seen in the emergency room.  We will add amlodipine 5 mg daily for his blood pressure and he will follow-up with primary care for further management.  4 tobacco abuse-patient counseled on discontinuing.  Olga MillersBrian , MD

## 2018-12-08 NOTE — Patient Instructions (Signed)
Medication Instructions:  STOP LISINOPRIL  START AMLODIPINE 5 MG ONCE DAILY If you need a refill on your cardiac medications before your next appointment, please call your pharmacy.   Lab work: If you have labs (blood work) drawn today and your tests are completely normal, you will receive your results only by: Marland Kitchen MyChart Message (if you have MyChart) OR . A paper copy in the mail If you have any lab test that is abnormal or we need to change your treatment, we will call you to review the results.  Testing/Procedures: Your physician has requested that you have an echocardiogram. Echocardiography is a painless test that uses sound waves to create images of your heart. It provides your doctor with information about the size and shape of your heart and how well your heart's chambers and valves are working. This procedure takes approximately one hour. There are no restrictions for this procedure.    Follow-Up: At [facility], you and your health needs are our priority.  As part of our continuing mission to provide you with exceptional heart care, we have created designated Provider Care Teams.  These Care Teams include your primary Cardiologist (physician) and Advanced Practice Providers (APPs -  Physician Assistants and Nurse Practitioners) who all work together to provide you with the care you need, when you need it.  Your physician recommends that you schedule a follow-up appointment in: AS NEEDED PENDING TEST RESULTS

## 2018-12-09 ENCOUNTER — Other Ambulatory Visit: Payer: Self-pay

## 2018-12-09 ENCOUNTER — Encounter (HOSPITAL_COMMUNITY): Payer: Self-pay

## 2018-12-09 ENCOUNTER — Emergency Department (HOSPITAL_COMMUNITY)
Admission: EM | Admit: 2018-12-09 | Discharge: 2018-12-09 | Disposition: A | Discharge status: Home / Self Care | Payer: Self-pay | Attending: Emergency Medicine | Admitting: Emergency Medicine

## 2018-12-09 DIAGNOSIS — T7840XA Allergy, unspecified, initial encounter: Secondary | ICD-10-CM | POA: Insufficient documentation

## 2018-12-09 DIAGNOSIS — I10 Essential (primary) hypertension: Secondary | ICD-10-CM | POA: Insufficient documentation

## 2018-12-09 DIAGNOSIS — J45909 Unspecified asthma, uncomplicated: Secondary | ICD-10-CM | POA: Insufficient documentation

## 2018-12-09 DIAGNOSIS — F17210 Nicotine dependence, cigarettes, uncomplicated: Secondary | ICD-10-CM | POA: Insufficient documentation

## 2018-12-09 DIAGNOSIS — T783XXA Angioneurotic edema, initial encounter: Secondary | ICD-10-CM | POA: Insufficient documentation

## 2018-12-09 LAB — CBC WITH DIFFERENTIAL/PLATELET
Abs Immature Granulocytes: 0.01 10*3/uL (ref 0.00–0.07)
BASOS ABS: 0 10*3/uL (ref 0.0–0.1)
BASOS PCT: 1 %
Eosinophils Absolute: 0.3 10*3/uL (ref 0.0–0.5)
Eosinophils Relative: 8 %
HCT: 40.7 % (ref 39.0–52.0)
Hemoglobin: 13.8 g/dL (ref 13.0–17.0)
Immature Granulocytes: 0 %
Lymphocytes Relative: 40 %
Lymphs Abs: 1.7 10*3/uL (ref 0.7–4.0)
MCH: 31.9 pg (ref 26.0–34.0)
MCHC: 33.9 g/dL (ref 30.0–36.0)
MCV: 94 fL (ref 80.0–100.0)
Monocytes Absolute: 0.6 10*3/uL (ref 0.1–1.0)
Monocytes Relative: 15 %
Neutro Abs: 1.6 10*3/uL — ABNORMAL LOW (ref 1.7–7.7)
Neutrophils Relative %: 36 %
PLATELETS: 235 10*3/uL (ref 150–400)
RBC: 4.33 MIL/uL (ref 4.22–5.81)
RDW: 12.1 % (ref 11.5–15.5)
WBC: 4.3 10*3/uL (ref 4.0–10.5)
nRBC: 0 % (ref 0.0–0.2)

## 2018-12-09 LAB — BASIC METABOLIC PANEL
ANION GAP: 10 (ref 5–15)
BUN: 7 mg/dL (ref 6–20)
CO2: 27 mmol/L (ref 22–32)
Calcium: 9.5 mg/dL (ref 8.9–10.3)
Chloride: 97 mmol/L — ABNORMAL LOW (ref 98–111)
Creatinine, Ser: 1.04 mg/dL (ref 0.61–1.24)
Glucose, Bld: 109 mg/dL — ABNORMAL HIGH (ref 70–99)
Potassium: 3.9 mmol/L (ref 3.5–5.1)
Sodium: 134 mmol/L — ABNORMAL LOW (ref 135–145)

## 2018-12-09 MED ORDER — FAMOTIDINE IN NACL 20-0.9 MG/50ML-% IV SOLN
20.0000 mg | Freq: Once | INTRAVENOUS | Status: AC
  Administered 2018-12-09: 20 mg via INTRAVENOUS
  Filled 2018-12-09: qty 50

## 2018-12-09 MED ORDER — DIPHENHYDRAMINE HCL 50 MG/ML IJ SOLN
25.0000 mg | Freq: Once | INTRAMUSCULAR | Status: AC
  Administered 2018-12-09: 25 mg via INTRAVENOUS
  Filled 2018-12-09: qty 1

## 2018-12-09 MED ORDER — EPINEPHRINE 0.3 MG/0.3ML IJ SOAJ
0.3000 mg | Freq: Once | INTRAMUSCULAR | Status: AC
  Administered 2018-12-09: 0.3 mg via INTRAMUSCULAR
  Filled 2018-12-09: qty 0.3

## 2018-12-09 MED ORDER — HYDROCHLOROTHIAZIDE 12.5 MG PO TABS: 12.5000 mg | ORAL_TABLET | Freq: Every day | ORAL | 0 refills | Status: DC

## 2018-12-09 MED ORDER — METHYLPREDNISOLONE SODIUM SUCC 125 MG IJ SOLR
125.0000 mg | Freq: Once | INTRAMUSCULAR | Status: AC
  Administered 2018-12-09: 125 mg via INTRAVENOUS
  Filled 2018-12-09: qty 2

## 2018-12-09 MED ORDER — IPRATROPIUM-ALBUTEROL 0.5-2.5 (3) MG/3ML IN SOLN
3.0000 mL | Freq: Once | RESPIRATORY_TRACT | Status: AC
  Administered 2018-12-09: 3 mL via RESPIRATORY_TRACT
  Filled 2018-12-09: qty 3

## 2018-12-09 NOTE — ED Notes (Signed)
Pt IV has been discontinued.

## 2018-12-09 NOTE — ED Notes (Signed)
Pt reports he feels like his lip is improving slightly

## 2018-12-09 NOTE — ED Notes (Signed)
Pt stats lip feels "less tight".

## 2018-12-09 NOTE — ED Notes (Signed)
Pt taking po fluids and tolerating well. 

## 2018-12-09 NOTE — ED Triage Notes (Signed)
Pt arrives ambulatory with c/o facial swelling that started yesterday at lower face. Pt states woke with more facial swelling at top lip this am. Pt started lisionopril last Tuesday

## 2018-12-09 NOTE — Discharge Instructions (Addendum)
Do not take anymore lisinopril.  As we discussed, you can start the hypertension medication as directed.  Your primary care doctor today and arrange for an appointment in the next 24 to 48 hours.  Return the emergency department immediately if you feel like the tongue or lip swelling is returning, you have difficulty swallowing her saliva, difficulty breathing, vomiting or any other worsening or concerning symptoms.

## 2018-12-09 NOTE — ED Notes (Signed)
Pt stats he understands instructions home stable with steady gait.

## 2018-12-09 NOTE — ED Provider Notes (Signed)
[facility] EMERGENCY DEPARTMENT Provider Note   CSN: [id number] Arrival date & time: 12/09/18  0759     History   Chief Complaint Chief Complaint  Patient presents with  . Allergic Reaction    HPI Anthony Zavala is a 43 y.o. male past with history of asthma, hypertension who presents for evaluation of upper lip swelling that began this morning.  Patient reports that yesterday, he noticed that his lower lip had a swollen significantly.  He was seen by cardiology who suspected that he was having a reaction to lisinopril which he was recently started on.  He was told to stop the lisinopril and take Benadryl.  Patient reports that it improved his lower lip swelling but then reports that when he woke up today, he noticed that his upper lip was swollen.  Patient states he has not noticed any tongue swelling.  He is not having difficulty tolerating secretions or p.o.  He reports he has been able to eat and drink without any difficulty.  Patient reports that he was recently started on lisinopril for management of his high blood pressure a few days ago.  He reports he has never taken any blood pressure medication previously.  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  Patient also reports feeling like the bottom of his feet are swollen.  Patient denies any other new exposures, lotions, soaps, detergents.  No other new medications.  The history is provided by the patient.    Past Medical History:  Diagnosis Date  . Asthma   . Hypertension     Patient Active Problem List   Diagnosis Date Noted  . Essential hypertension 12/05/2018  . Nonspecific ST-T wave electrocardiographic changes 12/05/2018    Past Surgical History:  Procedure Laterality Date  . No prior surgery          Home Medications    Prior to Admission medications   Medication Sig Start Date End Date Taking? Authorizing Provider  amLODipine (NORVASC) 5  MG tablet Take 1 tablet (5 mg total) by mouth daily. 12/08/18 03/08/19  Lewayne Bunting, MD  hydrochlorothiazide (HYDRODIURIL) 12.5 MG tablet Take 1 tablet (12.5 mg total) by mouth daily for 14 days. 12/09/18 12/23/18  Maxwell Caul, PA-C    Family History Family History  Problem Relation Age of Onset  . Hypertension Mother     Social History Social History   Tobacco Use  . Smoking status: Current Every Day Smoker    Packs/day: 0.25    Types: Cigarettes  . Smokeless tobacco: Never Used  Substance Use Topics  . Alcohol use: Yes    Comment: socially  . Drug use: No     Allergies   Lisinopril   Review of Systems Review of Systems  HENT: Positive for facial swelling. Negative for drooling and trouble swallowing.   Respiratory: Negative for shortness of breath.   Cardiovascular: Negative for chest pain.  Gastrointestinal: Negative for abdominal pain, nausea and vomiting.  Neurological: Negative for headaches.  All other systems reviewed and are negative.    Physical Exam Updated Vital Signs BP (!) 143/106   Pulse 72   Temp 98.7 F (37.1 C)   Resp 16   Ht 5\' 6"  (1.676 m)   Wt 68.9 kg   SpO2 98%   BMI 24.53 kg/m   Physical Exam Vitals signs and nursing note reviewed.  Constitutional:      Appearance:  Normal appearance. He is well-developed.  HENT:     Head: Normocephalic and atraumatic.     Comments: Right half of upper lip is swollen.  Face is symmetric in appearance without any overlying warmth, erythema, edema.  Airways patent, phonation is intact.    Mouth/Throat:     Lips: Pink.     Mouth: Mucous membranes are moist.     Dentition: No dental abscesses.     Pharynx: Oropharynx is clear. Uvula midline.     Comments: Uvula is midline.  No trismus.  No swelling noted to floor of mouth. Eyes:     General: Lids are normal.     Conjunctiva/sclera: Conjunctivae normal.     Pupils: Pupils are equal, round, and reactive to light.  Neck:      Musculoskeletal: Full passive range of motion without pain.  Cardiovascular:     Rate and Rhythm: Normal rate and regular rhythm.     Pulses: Normal pulses.          Radial pulses are 2+ on the right side and 2+ on the left side.       Dorsalis pedis pulses are 2+ on the right side and 2+ on the left side.     Heart sounds: Normal heart sounds. No murmur. No friction rub. No gallop.   Pulmonary:     Effort: Pulmonary effort is normal.     Breath sounds: Wheezing present.     Comments: Mild wheezing noted.  Otherwise lungs clear to auscultation bilaterally.  No evidence of respiratory distress.  Able to speak in full sentences without any difficulty. Abdominal:     Palpations: Abdomen is soft. Abdomen is not rigid.     Tenderness: There is no abdominal tenderness. There is no guarding.     Comments: Abdomen is soft, non-distended, non-tender. No rigidity, No guarding. No peritoneal signs.  Musculoskeletal: Normal range of motion.  Skin:    General: Skin is warm and dry.     Capillary Refill: Capillary refill takes less than 2 seconds.  Neurological:     Mental Status: He is alert and oriented to person, place, and time.  Psychiatric:        Speech: Speech normal.      ED Treatments / Results  Labs (all labs ordered are listed, but only abnormal results are displayed) Labs Reviewed  BASIC METABOLIC PANEL - Abnormal; Notable for the following components:      Result Value   Sodium 134 (*)    Chloride 97 (*)    Glucose, Bld 109 (*)    All other components within normal limits  CBC WITH DIFFERENTIAL/PLATELET - Abnormal; Notable for the following components:   Neutro Abs 1.6 (*)    All other components within normal limits    EKG EKG Interpretation  Date/Time:  Tuesday December 09 2018 08:27:00 EST Ventricular Rate:  72 PR Interval:    QRS Duration: 90 QT Interval:  424 QTC Calculation: 468 R Axis:   74 Text Interpretation:  Sinus rhythm Borderline short PR interval Repol  abnrm suggests ischemia, lateral leads Baseline wander in lead(s) V4 V5 V6 No significant change since last tracing Confirmed by Gwyneth SproutPlunkett, Whitney ([id number]) on 12/09/2018 9:03:28 AM   Radiology No results found.  Procedures Procedures (including critical care time)  Medications Ordered in ED Medications  methylPREDNISolone sodium succinate (SOLU-MEDROL) 125 mg/2 mL injection 125 mg (125 mg Intravenous Given 12/09/18 0821)  diphenhydrAMINE (BENADRYL) injection 25 mg (25 mg Intravenous Given 12/09/18  8891)  famotidine (PEPCID) IVPB 20 mg premix (0 mg Intravenous Stopped 12/09/18 0915)  EPINEPHrine (EPI-PEN) injection 0.3 mg (0.3 mg Intramuscular Given 12/09/18 0824)  ipratropium-albuterol (DUONEB) 0.5-2.5 (3) MG/3ML nebulizer solution 3 mL (3 mLs Nebulization Given 12/09/18 6945)     Initial Impression / Assessment and Plan / ED Course  I have reviewed the triage vital signs and the nursing notes.  Pertinent labs & imaging results that were available during my care of the patient were reviewed by me and considered in my medical decision making (see chart for details).     43 year old male who presents for evaluation of upper lip swelling that began this morning.  Recently started on lisinopril.  Was experiencing some lower lip swelling.  Saw cardiologist yesterday who told him to discontinue lisinopril.  Last dose was 2 days ago.  No difficulty breathing, tolerating p.o., tongue swelling. Patient is afebrile, non-toxic appearing, sitting comfortably on examination table. Vital signs reviewed and stable.  On exam, he has angioedema with noted swelling noted to the right upper lip. Concern for reaction to lisinopril.  History/physical exam is not concerning for Ludwig angina or peritonsillar abscess.  Additionally, no evidence of infectious process.  No tongue swelling.  No swelling noted for mouth.  Plan for labs.  Will give EpiPen, Benadryl, steroids, Pepcid and observe.  BMP unremarkable.  CBC  without any significant leukocytosis or anemia.  Reevaluation.  Patient is also swelling of his upper lip but it has improved since his arrival.  We will still observe the 4-hour mark given epi.  We will plan to p.o. challenge.  Patient has been observed for hours.  Angioedema seems to be improving.  He still has some very mild swelling of the lips but he states it feels better.  He is tolerating secretions without any difficulty.  He has been able to tolerate p.o. without any difficulty.  Vital signs are stable.  Airways patent, phonation is intact.  I discussed with patient that this most likely reaction to lisinopril.  Instructed him to not take lisinopril again.  We will plan to start him on small dose of HCTZ.  Instructed him to follow-up with his primary care doctor this week for reevaluation of his symptoms as well as discussion of management of his blood pressure.  Review of records show that his cardiologist had switched him to amlodipine but he reports he had not been taking the prescription.  Gust with him amlodipine versus HCTZ and he feels better with starting HCTZ. At this time, patient exhibits no emergent life-threatening condition that require further evaluation in ED or admission. Patient had ample opportunity for questions and discussion. All patient's questions were answered with full understanding. Strict return precautions discussed. Patient expresses understanding and agreement to plan.   Portions of this note were generated with Scientist, clinical (histocompatibility and immunogenetics). Dictation errors may occur despite best attempts at proofreading.   Final Clinical Impressions(s) / ED Diagnoses   Final diagnoses:  Angioedema, initial encounter  Allergic reaction, initial encounter    ED Discharge Orders         Ordered    hydrochlorothiazide (HYDRODIURIL) 12.5 MG tablet  Daily     12/09/18 1223           Rosana Hoes 12/09/18 1436    Gwyneth Sprout, MD 12/13/18 1344

## 2018-12-11 ENCOUNTER — Other Ambulatory Visit (HOSPITAL_COMMUNITY): Payer: Self-pay

## 2018-12-15 ENCOUNTER — Ambulatory Visit (HOSPITAL_COMMUNITY): Payer: Self-pay | Attending: Cardiology

## 2018-12-15 ENCOUNTER — Other Ambulatory Visit: Payer: Self-pay

## 2018-12-15 DIAGNOSIS — R9431 Abnormal electrocardiogram [ECG] [EKG]: Secondary | ICD-10-CM | POA: Insufficient documentation

## 2018-12-24 ENCOUNTER — Encounter (INDEPENDENT_AMBULATORY_CARE_PROVIDER_SITE_OTHER): Payer: Self-pay

## 2018-12-24 ENCOUNTER — Inpatient Hospital Stay (INDEPENDENT_AMBULATORY_CARE_PROVIDER_SITE_OTHER): Payer: Self-pay | Admitting: Orthopedic Surgery

## 2019-01-02 ENCOUNTER — Telehealth (INDEPENDENT_AMBULATORY_CARE_PROVIDER_SITE_OTHER): Payer: Self-pay | Admitting: Orthopedic Surgery

## 2019-01-02 NOTE — Telephone Encounter (Signed)
Patient called to state he is requesting a refill something for pain.  Please call patient @ [phone]

## 2019-01-02 NOTE — Telephone Encounter (Signed)
Please advise 

## 2019-01-05 MED ORDER — ACETAMINOPHEN-CODEINE #3 300-30 MG PO TABS: 1.0000 | ORAL_TABLET | Freq: Two times a day (BID) | ORAL | 0 refills | Status: DC | PRN

## 2019-01-05 NOTE — Telephone Encounter (Signed)
I called patient. He states that he cannot take Tramadol due to it making him hallucinate.  Per Dr. August Saucerean, ok for Tylenol #3 1 po bid prn pain #30 with no refill.

## 2019-01-05 NOTE — Telephone Encounter (Signed)
Script called in. Patient advised.

## 2019-01-05 NOTE — Telephone Encounter (Signed)
Ok for tramadol 1 po q 8 - 12 # 35 pls clal thx

## 2019-01-12 ENCOUNTER — Other Ambulatory Visit (INDEPENDENT_AMBULATORY_CARE_PROVIDER_SITE_OTHER): Payer: Self-pay | Admitting: Orthopedic Surgery

## 2019-01-12 DIAGNOSIS — S46012D Strain of muscle(s) and tendon(s) of the rotator cuff of left shoulder, subsequent encounter: Secondary | ICD-10-CM

## 2019-01-13 ENCOUNTER — Ambulatory Visit: Payer: Self-pay | Admitting: Family Medicine

## 2019-01-14 NOTE — Pre-Procedure Instructions (Signed)
Anthony Zavala  01/14/2019      [id number] - [place], [place] - [phone] [facility] [address] [place] [address] Phone: [phone] Fax: [phone]    Your procedure is scheduled on 01/20/19.  Report to [facility] Admitting at 1245 PM  Call this number if you have problems the morning of surgery:  [phone]   Remember:  Do not eat or drink after midnight.   Take these medicines the morning of surgery with A SIP OF WATER ---tylenol #3,norvasc    Do not wear jewelry, make-up or Zavala polish.  Do not wear lotions, powders, or perfumes, or deodorant.  Do not shave 48 hours prior to surgery.  Men may shave face and neck.  Do not bring valuables to the hospital.  [facility] is not responsible for any belongings or valuables.  Contacts, dentures or bridgework may not be worn into surgery.  Leave your suitcase in the car.  After surgery it may be brought to your room.  For patients admitted to the hospital, discharge time will be determined by your treatment team.  Patients discharged the day of surgery will not be allowed to drive home.   Name and phone number of your driver:   Do not take any aspirin,anti-inflammatories,vitamins,or herbal supplements 5-7 days prior to surgery. Special instructions:    Please read over the following fact sheets that you were given.   [place] - Preparing for Surgery  Before surgery, you can play an important role.  Because skin is not sterile, your skin needs to be as free of germs as possible.  You can reduce the number of germs on you skin by washing with CHG (chlorahexidine gluconate) soap before surgery.  CHG is an antiseptic cleaner which kills germs and bonds with the skin to continue killing germs even after washing.  Oral Hygiene is also important in reducing the risk of infection.  Remember to brush your teeth with your regular toothpaste the morning of  surgery.  Please DO NOT use if you have an allergy to CHG or antibacterial soaps.  If your skin becomes reddened/irritated stop using the CHG and inform your nurse when you arrive at Short Stay.  Do not shave (including legs and underarms) for at least 48 hours prior to the first CHG shower.  You may shave your face.  Please follow these instructions carefully:   1.  Shower with CHG Soap the night before surgery and the morning of Surgery.  2.  If you choose to wash your hair, wash your hair first as usual with your normal shampoo.  3.  After you shampoo, rinse your hair and body thoroughly to remove the shampoo. 4.  Use CHG as you would any other liquid soap.  You can apply chg directly to the skin and wash gently with a      scrungie or washcloth.           5.  Apply the CHG Soap to your body ONLY FROM THE NECK DOWN.   Do not use on open wounds or open sores. Avoid contact with your eyes, ears, mouth and genitals (private parts).  Wash genitals (private parts) with your normal soap.  6.  Wash thoroughly, paying special attention to the area where your surgery will be performed.  7.  Thoroughly rinse your body with warm water from the neck down.  8.  DO NOT shower/wash with  your normal soap after using and rinsing off the CHG Soap.  9.  Pat yourself dry with a clean towel.            10.  Wear clean pajamas.            11.  Place clean sheets on your bed the night of your first shower and do not sleep with pets.  Day of Surgery  Do not apply any lotions/deoderants the morning of surgery.   Please wear clean clothes to the hospital/surgery center. Remember to brush your teeth with toothpaste.

## 2019-01-15 ENCOUNTER — Encounter (HOSPITAL_COMMUNITY)
Admission: RE | Admit: 2019-01-15 | Discharge: 2019-01-15 | Disposition: A | Discharge status: Home / Self Care | Payer: Self-pay | Source: Ambulatory Visit | Attending: Orthopedic Surgery | Admitting: Orthopedic Surgery

## 2019-01-15 ENCOUNTER — Other Ambulatory Visit: Payer: Self-pay

## 2019-01-15 ENCOUNTER — Encounter (HOSPITAL_COMMUNITY): Payer: Self-pay

## 2019-01-15 DIAGNOSIS — Z01812 Encounter for preprocedural laboratory examination: Secondary | ICD-10-CM | POA: Insufficient documentation

## 2019-01-15 LAB — CBC
HCT: 44.1 % (ref 39.0–52.0)
Hemoglobin: 14.6 g/dL (ref 13.0–17.0)
MCH: 31.2 pg (ref 26.0–34.0)
MCHC: 33.1 g/dL (ref 30.0–36.0)
MCV: 94.2 fL (ref 80.0–100.0)
Platelets: 125 10*3/uL — ABNORMAL LOW (ref 150–400)
RBC: 4.68 MIL/uL (ref 4.22–5.81)
RDW: 12.5 % (ref 11.5–15.5)
WBC: 3.3 10*3/uL — ABNORMAL LOW (ref 4.0–10.5)
nRBC: 0 % (ref 0.0–0.2)

## 2019-01-15 LAB — BASIC METABOLIC PANEL
Anion gap: 13 (ref 5–15)
BUN: <5 mg/dL — ABNORMAL LOW (ref 6–20)
CO2: 24 mmol/L (ref 22–32)
Calcium: 9.5 mg/dL (ref 8.9–10.3)
Chloride: 98 mmol/L (ref 98–111)
Creatinine, Ser: 0.89 mg/dL (ref 0.61–1.24)
GFR calc Af Amer: >60 mL/min (ref >60)
GLUCOSE: 84 mg/dL (ref 70–99)
Potassium: 3.4 mmol/L — ABNORMAL LOW (ref 3.5–5.1)
Sodium: 135 mmol/L (ref 135–145)

## 2019-01-15 NOTE — Progress Notes (Signed)
PCP - Joaquin Courts, FNP Cardiologist - Dr. Jens Som  Chest x-ray - N/A EKG -  12/09/18 Stress Test - denies ECHO - 12/15/18 Cardiac Cath - denies  Sleep Study - denies  Aspirin Instructions: Patient instructed to hold all Aspirin, NSAID's, herbal medications, fish oil and vitamins 7 days prior to surgery.  Anesthesia review: review EKG/ECHO  Patient denies shortness of breath, fever, cough and chest pain at PAT appointment   Patient verbalized understanding of instructions that were given to them at the PAT appointment. Patient was also instructed that they will need to review over the PAT instructions again at home before surgery.

## 2019-01-15 NOTE — Pre-Procedure Instructions (Signed)
JAION RAVELO  01/15/2019      [id number] - [address] [address], [place] - [phone] AT [address] [facility] [address] [place] [address] Phone: [phone] Fax: [phone]    Your procedure is scheduled on Tuesday February 25th.  Report to [facility] Admitting at 12:45 PM.  Call this number if you have problems the morning of surgery:  [phone]   Remember:  Do not eat or drink after midnight.     Take these medicines the morning of surgery with A SIP OF WATER  acetaminophen-codeine (TYLENOL #3)  amLODipine (NORVASC)  7 days prior to surgery STOP taking any Aspirin(unless otherwise instructed by your surgeon), Aleve, Naproxen, Ibuprofen, Motrin, Advil, Goody's, BC's, all herbal medications, fish oil, and all vitamins     Do not wear jewelry  Do not wear lotions, powders, or colognes, or deodorant.  Do not shave 48 hours prior to surgery.  Men may shave face and neck.  Do not bring valuables to the hospital.  [facility] is not responsible for any belongings or valuables.  Contacts, dentures or bridgework may not be worn into surgery.  Leave your suitcase in the car.  After surgery it may be brought to your room.  For patients admitted to the hospital, discharge time will be determined by your treatment team.  Patients discharged the day of surgery will not be allowed to drive home.   [place]- Preparing For Surgery  Before surgery, you can play an important role. Because skin is not sterile, your skin needs to be as free of germs as possible. You can reduce the number of germs on your skin by washing with CHG (chlorahexidine gluconate) Soap before surgery.  CHG is an antiseptic cleaner which kills germs and bonds with the skin to continue killing germs even after washing.    Oral Hygiene is also important to reduce your risk of infection.  Remember - BRUSH YOUR TEETH THE MORNING OF SURGERY WITH YOUR REGULAR  TOOTHPASTE  Please do not use if you have an allergy to CHG or antibacterial soaps. If your skin becomes reddened/irritated stop using the CHG.  Do not shave (including legs and underarms) for at least 48 hours prior to first CHG shower. It is OK to shave your face.  Please follow these instructions carefully.   1. Shower the NIGHT BEFORE SURGERY and the MORNING OF SURGERY with CHG.   2. If you chose to wash your hair, wash your hair first as usual with your normal shampoo.  3. After you shampoo, rinse your hair and body thoroughly to remove the shampoo.  4. Use CHG as you would any other liquid soap. You can apply CHG directly to the skin and wash gently with a scrungie or a clean washcloth.   5. Apply the CHG Soap to your body ONLY FROM THE NECK DOWN.  Do not use on open wounds or open sores. Avoid contact with your eyes, ears, mouth and genitals (private parts). Wash Face and genitals (private parts)  with your normal soap.  6. Wash thoroughly, paying special attention to the area where your surgery will be performed.  7. Thoroughly rinse your body with warm water from the neck down.  8. DO NOT shower/wash with your normal soap after using and rinsing off the CHG Soap.  9. Pat yourself dry with a CLEAN TOWEL.  10. Wear CLEAN PAJAMAS to bed the night before surgery, wear comfortable clothes  the morning of surgery  11. Place CLEAN SHEETS on your bed the night of your first shower and DO NOT SLEEP WITH PETS.    Day of Surgery: Shower as stated above. Do not apply any deodorants/lotions.  Please wear clean clothes to the hospital/surgery center.   Remember to brush your teeth WITH YOUR REGULAR TOOTHPASTE.   Please read over the following fact sheets that you were given.

## 2019-01-16 ENCOUNTER — Encounter (HOSPITAL_COMMUNITY): Payer: Self-pay | Admitting: Physician Assistant

## 2019-01-16 ENCOUNTER — Encounter (HOSPITAL_COMMUNITY): Payer: Self-pay | Admitting: Anesthesiology

## 2019-01-16 NOTE — Anesthesia Preprocedure Evaluation (Deleted)
Anesthesia Evaluation    Airway        Dental   Pulmonary Current Smoker,           Cardiovascular hypertension,      Neuro/Psych    GI/Hepatic   Endo/Other    Renal/GU      Musculoskeletal   Abdominal   Peds  Hematology   Anesthesia Other Findings   Reproductive/Obstetrics                             Anesthesia Physical Anesthesia Plan  ASA:   Anesthesia Plan:    Post-op Pain Management:    Induction:   PONV Risk Score and Plan:   Airway Management Planned:   Additional Equipment:   Intra-op Plan:   Post-operative Plan:   Informed Consent:   Plan Discussed with:   Anesthesia Plan Comments: (See note by Antionette Poles, PA-C 11/10/18. In the interim pt was evaluated by cardiology for abn EKG. Had echo showing EF 55-60%, normal wall motion, no significant valvular abnormalities. Cleared for surgery.)        Anesthesia Quick Evaluation

## 2019-01-20 ENCOUNTER — Other Ambulatory Visit: Payer: Self-pay

## 2019-01-20 ENCOUNTER — Emergency Department (HOSPITAL_COMMUNITY): Payer: Self-pay

## 2019-01-20 ENCOUNTER — Encounter (HOSPITAL_COMMUNITY)
Admission: RE | Disposition: A | Discharge status: Other Acute Inpatient Hospital | Payer: Self-pay | Source: Ambulatory Visit | Attending: Orthopedic Surgery

## 2019-01-20 ENCOUNTER — Encounter (HOSPITAL_COMMUNITY): Payer: Self-pay

## 2019-01-20 ENCOUNTER — Telehealth: Payer: Self-pay | Admitting: Family Medicine

## 2019-01-20 ENCOUNTER — Ambulatory Visit (HOSPITAL_COMMUNITY)
Admission: RE | Admit: 2019-01-20 | Discharge: 2019-01-20 | Payer: Self-pay | Source: Ambulatory Visit | Attending: Orthopedic Surgery | Admitting: Orthopedic Surgery

## 2019-01-20 ENCOUNTER — Emergency Department (HOSPITAL_COMMUNITY)
Admission: EM | Admit: 2019-01-20 | Discharge: 2019-01-20 | Disposition: A | Discharge status: Home / Self Care | Payer: Self-pay | Attending: Emergency Medicine | Admitting: Emergency Medicine

## 2019-01-20 DIAGNOSIS — M75102 Unspecified rotator cuff tear or rupture of left shoulder, not specified as traumatic: Secondary | ICD-10-CM | POA: Insufficient documentation

## 2019-01-20 DIAGNOSIS — F17210 Nicotine dependence, cigarettes, uncomplicated: Secondary | ICD-10-CM | POA: Insufficient documentation

## 2019-01-20 DIAGNOSIS — S46012D Strain of muscle(s) and tendon(s) of the rotator cuff of left shoulder, subsequent encounter: Secondary | ICD-10-CM

## 2019-01-20 DIAGNOSIS — I10 Essential (primary) hypertension: Secondary | ICD-10-CM | POA: Insufficient documentation

## 2019-01-20 DIAGNOSIS — Z5309 Procedure and treatment not carried out because of other contraindication: Secondary | ICD-10-CM | POA: Insufficient documentation

## 2019-01-20 DIAGNOSIS — Z8249 Family history of ischemic heart disease and other diseases of the circulatory system: Secondary | ICD-10-CM | POA: Insufficient documentation

## 2019-01-20 DIAGNOSIS — J45909 Unspecified asthma, uncomplicated: Secondary | ICD-10-CM | POA: Insufficient documentation

## 2019-01-20 DIAGNOSIS — Z79899 Other long term (current) drug therapy: Secondary | ICD-10-CM | POA: Insufficient documentation

## 2019-01-20 SURGERY — SHOULDER ARTHROSCOPY WITH SUBACROMIAL DECOMPRESSION, ROTATOR CUFF REPAIR AND BICEP TENDON REPAIR
Anesthesia: General | Laterality: Left

## 2019-01-20 MED ORDER — HYDRALAZINE HCL 20 MG/ML IJ SOLN
INTRAMUSCULAR | Status: AC
  Filled 2019-01-20: qty 1

## 2019-01-20 MED ORDER — CEFAZOLIN SODIUM-DEXTROSE 2-4 GM/100ML-% IV SOLN: 2.0000 g | INTRAVENOUS | Status: DC

## 2019-01-20 MED ORDER — EPINEPHRINE PF 1 MG/ML IJ SOLN
INTRAMUSCULAR | Status: AC
  Filled 2019-01-20: qty 2

## 2019-01-20 MED ORDER — CEFAZOLIN SODIUM-DEXTROSE 2-4 GM/100ML-% IV SOLN
INTRAVENOUS | Status: AC
  Filled 2019-01-20: qty 100

## 2019-01-20 MED ORDER — ACETAMINOPHEN 500 MG PO TABS
1000.0000 mg | ORAL_TABLET | Freq: Once | ORAL | Status: AC
  Administered 2019-01-20: 1000 mg via ORAL
  Filled 2019-01-20: qty 2

## 2019-01-20 MED ORDER — LACTATED RINGERS IV SOLN: INTRAVENOUS | Status: DC

## 2019-01-20 MED ORDER — HYDRALAZINE HCL 20 MG/ML IJ SOLN
5.0000 mg | INTRAMUSCULAR | Status: AC | PRN
  Administered 2019-01-20: 13:00:00 via INTRAVENOUS
  Administered 2019-01-20 (×2): 5 mg via INTRAVENOUS
  Administered 2019-01-20: 14:00:00 via INTRAVENOUS

## 2019-01-20 MED ORDER — CHLORHEXIDINE GLUCONATE 4 % EX LIQD: 60.0000 mL | Freq: Once | CUTANEOUS | Status: DC

## 2019-01-20 MED ORDER — ALBUTEROL SULFATE (2.5 MG/3ML) 0.083% IN NEBU
5.0000 mg | INHALATION_SOLUTION | Freq: Once | RESPIRATORY_TRACT | Status: AC
  Administered 2019-01-20: 5 mg via RESPIRATORY_TRACT
  Filled 2019-01-20: qty 6

## 2019-01-20 NOTE — Telephone Encounter (Signed)
Please call and schedule patient for Hypertension management 01/26/19 at 8:30 am. Ok to double book otherwise patient will have to wait until March 19.

## 2019-01-20 NOTE — ED Provider Notes (Addendum)
[facility] EMERGENCY DEPARTMENT Provider Note   CSN: [id number] Arrival date & time: 01/20/19  1409    History   Chief Complaint Chief Complaint  Patient presents with  . Hypertension  . Shortness of Breath    HPI Anthony Zavala is a 43 y.o. male.     43 yo M with a cc of hypertension.  Checked in for outpatient surgery and found to be hypertensive.  Given 4 doses of hydralazine without improvement with HTN, but felt like his heart felt funny, had shortness of breath.  Sent to the ED.  Complaining of pain to the left shoulder.  Denies other complaint.   The history is provided by the patient.  Hypertension  This is a chronic problem. The current episode started more than 1 week ago. The problem occurs constantly. The problem has not changed since onset.Associated symptoms include chest pain and shortness of breath. Pertinent negatives include no abdominal pain and no headaches. Nothing aggravates the symptoms. Nothing relieves the symptoms. He has tried nothing for the symptoms. The treatment provided no relief.  Shortness of Breath  Associated symptoms: chest pain   Associated symptoms: no abdominal pain, no fever, no headaches, no rash and no vomiting     Past Medical History:  Diagnosis Date  . Asthma   . Hypertension     Patient Active Problem List   Diagnosis Date Noted  . Essential hypertension 12/05/2018  . Nonspecific ST-T wave electrocardiographic changes 12/05/2018    Past Surgical History:  Procedure Laterality Date  . NO PAST SURGERIES    . No prior surgery          Home Medications    Prior to Admission medications   Medication Sig Start Date End Date Taking? Authorizing Provider  acetaminophen-codeine (TYLENOL #3) 300-30 MG tablet Take 1 tablet by mouth 2 (two) times daily as needed for moderate pain. Patient taking differently: Take 1-1.5 tablets by mouth 2 (two) times daily as needed for moderate pain.  01/05/19   Cammy Copa, MD  amLODipine (NORVASC) 5 MG tablet Take 1 tablet (5 mg total) by mouth daily. Patient not taking: Reported on 01/12/2019 12/08/18 03/08/19  Lewayne Bunting, MD  hydrochlorothiazide (HYDRODIURIL) 12.5 MG tablet Take 1 tablet (12.5 mg total) by mouth daily for 14 days. Patient not taking: Reported on 01/12/2019 12/09/18 12/23/18  Maxwell Caul, PA-C    Family History Family History  Problem Relation Age of Onset  . Hypertension Mother     Social History Social History   Tobacco Use  . Smoking status: Current Every Day Smoker    Packs/day: 0.25    Types: Cigarettes  . Smokeless tobacco: Never Used  Substance Use Topics  . Alcohol use: Yes    Comment: socially  . Drug use: No     Allergies   Lisinopril and Tramadol   Review of Systems Review of Systems  Constitutional: Negative for chills and fever.  HENT: Negative for congestion and facial swelling.   Eyes: Negative for discharge and visual disturbance.  Respiratory: Positive for shortness of breath.   Cardiovascular: Positive for chest pain. Negative for palpitations.  Gastrointestinal: Negative for abdominal pain, diarrhea and vomiting.  Musculoskeletal: Positive for arthralgias. Negative for myalgias.  Skin: Negative for color change and rash.  Neurological: Negative for tremors, syncope and headaches.  Psychiatric/Behavioral: Negative for confusion and dysphoric mood.     Physical Exam Updated Vital Signs BP (!) 148/112   Pulse  100   Temp 97.9 F (36.6 C)   Resp 18   SpO2 99%   Physical Exam Vitals signs and nursing note reviewed.  Constitutional:      Appearance: He is well-developed.  HENT:     Head: Normocephalic and atraumatic.  Eyes:     Pupils: Pupils are equal, round, and reactive to light.  Neck:     Musculoskeletal: Normal range of motion and neck supple.     Vascular: No JVD.  Cardiovascular:     Rate and Rhythm: Normal rate and regular rhythm.     Heart sounds: No  murmur. No friction rub. No gallop.   Pulmonary:     Effort: No respiratory distress.     Breath sounds: No wheezing.  Abdominal:     General: There is no distension.     Tenderness: There is no guarding or rebound.  Musculoskeletal: Normal range of motion.  Skin:    Coloration: Skin is not pale.     Findings: No rash.  Neurological:     Mental Status: He is alert and oriented to person, place, and time.     GCS: GCS eye subscore is 4. GCS verbal subscore is 5. GCS motor subscore is 6.     Cranial Nerves: Cranial nerves are intact.     Sensory: Sensation is intact.     Motor: Motor function is intact.     Coordination: Coordination is intact.     Gait: Gait is intact.     Deep Tendon Reflexes: Babinski sign absent on the right side. Babinski sign absent on the left side.  Psychiatric:        Behavior: Behavior normal.      ED Treatments / Results  Labs (all labs ordered are listed, but only abnormal results are displayed) Labs Reviewed - No data to display  EKG EKG Interpretation  Date/Time:  Tuesday January 20 2019 14:24:11 EST Ventricular Rate:  110 PR Interval:  118 QRS Duration: 84 QT Interval:  476 QTC Calculation: 644 R Axis:   75 Text Interpretation:  Sinus tachycardia Prolonged QT Abnormal ECG No significant change since last tracing Confirmed by Melene Plan [phone] on 01/20/2019 4:40:36 PM   Radiology Dg Chest 2 View  Result Date: 01/20/2019 CLINICAL DATA:  Shortness of breath EXAM: CHEST - 2 VIEW COMPARISON:  March 18, 2016 FINDINGS: There is scarring in the apices, slightly more on the right than on the left. There is no edema or consolidation. The heart size and pulmonary vascularity are normal. No adenopathy. No bone lesions. IMPRESSION: Apical scarring. No edema or consolidation. Stable cardiac silhouette. Electronically Signed   By: Bretta Bang III M.D.   On: 01/20/2019 15:10    Procedures Procedures (including critical care time)  Medications  Ordered in ED Medications  albuterol (PROVENTIL) (2.5 MG/3ML) 0.083% nebulizer solution 5 mg (5 mg Nebulization Given 01/20/19 1423)     Initial Impression / Assessment and Plan / ED Course  I have reviewed the triage vital signs and the nursing notes.  Pertinent labs & imaging results that were available during my care of the patient were reviewed by me and considered in my medical decision making (see chart for details).        43 yo M with HTN.  Noted while preop for a left shoulder procedure.    Had some symptoms immediately after hydralazine. Mild tachycardia and shaking post hydralazine as well.  Now feels fine.  Tachycardia resolved spontaneously while im  in the room.   Discussed lab work for tachycardia which patient is declining.  Will d/c home.    Patient asymptomatic with no noted s/s of end organ damage.  No chest pain, diaphoresis, nausea or other acs symptoms.  No headache or neurologic complaints,  no unequal pulses, normal pulse ox without rales or sob.  Feel this is unlikely to be a Hypertensive Emergency and recent studies suggest no benefit for inpatient admission.  There are also no studies to my knowledge suggesting that patients with hypertensive urgency have increased risk for end organ disease.The patient will follow up closely with their PCP.  Compliance with their medication stressed.    Chester Holstein, Christell Constant EH, et al. Characteristics and outcomes of patients presenting with hypertensive urgency in the office setting. JAMA Intern Med. 2016 Jul 1; 176(7): 981-8.    Final Clinical Impressions(s) / ED Diagnoses   Final diagnoses:  Essential hypertension    ED Discharge Orders    None       Melene Plan, DO 01/20/19 2230    Melene Plan, DO 01/20/19 2231

## 2019-01-20 NOTE — H&P (Signed)
Anthony Zavala is an 43 y.o. male.   Chief Complaint: Left shoulder pain HPI: [facility] Zavala is a 43 year old patient who injured his left shoulder moving furniture several months ago.  MRI scan confirms rotator cuff tear.  He is failed nonoperative measures.  Medically he has been optimized with blood pressure management.  Presents now for operative management after explanation of risks and benefits.  No personal or family history of DVT or pulmonary embolism.  Past Medical History:  Diagnosis Date  . Asthma   . Hypertension     Past Surgical History:  Procedure Laterality Date  . NO PAST SURGERIES    . No prior surgery      Family History  Problem Relation Age of Onset  . Hypertension Mother    Social History:  reports that he has been smoking cigarettes. He has been smoking about 0.25 packs per day. He has never used smokeless tobacco. He reports current alcohol use. He reports that he does not use drugs.  Allergies:  Allergies  Allergen Reactions  . Lisinopril     Angioedema-lip swelling  . Tramadol     Hallucinations     No medications prior to admission.    No results found for this or any previous visit (from the past 48 hour(s)). No results found.  Review of Systems  Musculoskeletal: Positive for joint pain.  All other systems reviewed and are negative.   There were no vitals taken for this visit. Physical Exam  Constitutional: He appears well-developed.  HENT:  Head: Normocephalic.  Eyes: Pupils are equal, round, and reactive to light.  Neck: Normal range of motion.  Cardiovascular: Normal rate.  Respiratory: Effort normal.  Neurological: He is alert.  Skin: Skin is warm.  Psychiatric: He has a normal mood and affect.  Evaluation of the left shoulder demonstrates some coarse grinding and crepitus with active and passive range of motion.  He does have a little bit of supraspinatus weakness on the left compared to the right but no restriction of passive range of  motion.  No definite AC joint tenderness on the left.  Impingement signs positive on the left.  No other masses lymphadenopathy or skin changes noted in that left shoulder girdle region.  Assessment/Plan Impression is left shoulder rotator cuff tear.  Plan is left shoulder arthroscopy with evaluation of the biceps tendon.  Mini open rotator cuff tear repair planned.  Risk and benefits are discussed including but not limited to infection nerve vessel damage shoulder stiffness as well as incomplete pain relief and incomplete restoration of full range of motion.  Patient understands the risk and benefits and agrees to proceed.  All questions answered  Burnard Bunting, MD 01/20/2019, 11:08 AM

## 2019-01-20 NOTE — ED Triage Notes (Signed)
Pt sent here from short stay for hypertension, pt was scheduled to have shoulder surgery today. Per note from short stay pt was given 20mg  of IV hydralazine with a Bp of 168/120 after administration. Pt currently 156/124 in triage. Pt denies any other symptoms. Pt a.o, nad ntoed. 18G R hand

## 2019-01-20 NOTE — ED Notes (Signed)
RN informed Pt can receive visitor  

## 2019-01-20 NOTE — Progress Notes (Signed)
Per Dr. Bradley Ferris, procedure is cancelled due to hypertension and instructed to take patient to ED.  Dr. August Saucer is aware.

## 2019-01-20 NOTE — ED Notes (Signed)
During triage pt started to c.o feeling short of breath and requested a breathing treatment. Lung sounds clear

## 2019-01-20 NOTE — Progress Notes (Addendum)
Contacted patients PCP- states they are unable to work patient in today or tomorrow. Scheduled him for "first available" 02/12/19 at 2:10 PM. Patient has received 20mg  of IV hydralazine. Last BP 168/120. Per Dr. Bradley Ferris- patient to be transferred to ED.   Dr. Bradley Ferris at bedside with patient. Aware BP readings of 184/130; 179/144; 196/119. Will order IV Hydralazine. Per Dr. Bradley Ferris, patient needs to see PCP as soon as possible to get reevaluated for his HTN. PCP is Joaquin Courts with [facility]. Office closed for lunch until 1330. Will contact office to schedule patient an OV once office reopens.

## 2019-01-20 NOTE — Discharge Instructions (Signed)
Return for chest pain, shortness of breath, headache, neck pain, unilateral numbness or weakness difficulty with speech or swallowing

## 2019-01-21 NOTE — Telephone Encounter (Signed)
Thx very much Selena Batten

## 2019-01-21 NOTE — Telephone Encounter (Signed)
Called patient, scheduled appointment for 01/26/2019 at 8:30.

## 2019-01-26 ENCOUNTER — Ambulatory Visit: Payer: Self-pay | Admitting: Family Medicine

## 2019-01-28 ENCOUNTER — Inpatient Hospital Stay (INDEPENDENT_AMBULATORY_CARE_PROVIDER_SITE_OTHER): Payer: Self-pay | Admitting: Orthopedic Surgery

## 2019-02-12 ENCOUNTER — Ambulatory Visit: Payer: Self-pay | Admitting: Family Medicine

## 2019-05-18 ENCOUNTER — Telehealth: Payer: Self-pay

## 2019-05-18 ENCOUNTER — Telehealth: Payer: Self-pay | Admitting: Orthopedic Surgery

## 2019-05-18 NOTE — Telephone Encounter (Signed)
Can patient be released to return to work without having surgery?

## 2019-05-18 NOTE — Telephone Encounter (Signed)
Patient came in today and is requesting a note/letter to return to work without restrictions.  He states his left shoulder does not bother him at all "it fixed itself" and he'd like to get back to work.    Please fax the note to employer at the following number:   [phone]

## 2019-05-18 NOTE — Telephone Encounter (Signed)
Please advise. Thanks.  

## 2019-05-18 NOTE — Telephone Encounter (Signed)
Patient came in today and is requesting a note/letter to return to work without restrictions.  He states his left shoulder does not bother him at all "it fixed itself" and he'd like to get back to work.

## 2019-05-19 ENCOUNTER — Telehealth: Payer: Self-pay | Admitting: Orthopedic Surgery

## 2019-05-19 NOTE — Telephone Encounter (Signed)
See other message. Will create note for patient.

## 2019-05-19 NOTE — Telephone Encounter (Signed)
The tear may or may not have healed itself but if he wants to go back to work that is okay.  Would be good to see him about 3 or 4 months from now to see how he is doing.

## 2019-05-19 NOTE — Telephone Encounter (Signed)
Completed and faxed.

## 2019-05-19 NOTE — Telephone Encounter (Signed)
Ok for that thx

## 2019-09-14 ENCOUNTER — Emergency Department (HOSPITAL_COMMUNITY): Payer: Self-pay

## 2019-09-14 ENCOUNTER — Emergency Department (HOSPITAL_COMMUNITY)
Admission: EM | Admit: 2019-09-14 | Discharge: 2019-09-14 | Disposition: A | Discharge status: Home / Self Care | Payer: Self-pay | Attending: Emergency Medicine | Admitting: Emergency Medicine

## 2019-09-14 ENCOUNTER — Other Ambulatory Visit: Payer: Self-pay

## 2019-09-14 ENCOUNTER — Encounter (HOSPITAL_COMMUNITY): Payer: Self-pay | Admitting: Emergency Medicine

## 2019-09-14 DIAGNOSIS — J45909 Unspecified asthma, uncomplicated: Secondary | ICD-10-CM | POA: Insufficient documentation

## 2019-09-14 DIAGNOSIS — K625 Hemorrhage of anus and rectum: Secondary | ICD-10-CM | POA: Insufficient documentation

## 2019-09-14 DIAGNOSIS — K920 Hematemesis: Secondary | ICD-10-CM | POA: Insufficient documentation

## 2019-09-14 DIAGNOSIS — I10 Essential (primary) hypertension: Secondary | ICD-10-CM | POA: Insufficient documentation

## 2019-09-14 DIAGNOSIS — F17210 Nicotine dependence, cigarettes, uncomplicated: Secondary | ICD-10-CM | POA: Insufficient documentation

## 2019-09-14 LAB — COMPREHENSIVE METABOLIC PANEL
ALT: 94 U/L — ABNORMAL HIGH (ref 0–44)
AST: 178 U/L — ABNORMAL HIGH (ref 15–41)
Albumin: 4.7 g/dL (ref 3.5–5.0)
Alkaline Phosphatase: 101 U/L (ref 38–126)
Anion gap: 16 — ABNORMAL HIGH (ref 5–15)
BUN: 5 mg/dL — ABNORMAL LOW (ref 6–20)
CO2: 26 mmol/L (ref 22–32)
Calcium: 9.9 mg/dL (ref 8.9–10.3)
Chloride: 91 mmol/L — ABNORMAL LOW (ref 98–111)
Creatinine, Ser: 1.06 mg/dL (ref 0.61–1.24)
GFR calc Af Amer: >60 mL/min (ref >60)
GFR calc non Af Amer: >60 mL/min (ref >60)
Glucose, Bld: 123 mg/dL — ABNORMAL HIGH (ref 70–99)
Potassium: 3.1 mmol/L — ABNORMAL LOW (ref 3.5–5.1)
Sodium: 133 mmol/L — ABNORMAL LOW (ref 135–145)
Total Bilirubin: 1.1 mg/dL (ref 0.3–1.2)
Total Protein: 8.5 g/dL — ABNORMAL HIGH (ref 6.5–8.1)

## 2019-09-14 LAB — URINALYSIS, ROUTINE W REFLEX MICROSCOPIC
Bilirubin Urine: NEGATIVE
Glucose, UA: NEGATIVE mg/dL
Ketones, ur: NEGATIVE mg/dL
Leukocytes,Ua: NEGATIVE
Nitrite: NEGATIVE
Protein, ur: NEGATIVE mg/dL
Specific Gravity, Urine: >1.046 — ABNORMAL HIGH (ref 1.005–1.030)
pH: 6 (ref 5.0–8.0)

## 2019-09-14 LAB — CBC
HCT: 45.1 % (ref 39.0–52.0)
Hemoglobin: 15.8 g/dL (ref 13.0–17.0)
MCH: 32.7 pg (ref 26.0–34.0)
MCHC: 35 g/dL (ref 30.0–36.0)
MCV: 93.4 fL (ref 80.0–100.0)
Platelets: 186 10*3/uL (ref 150–400)
RBC: 4.83 MIL/uL (ref 4.22–5.81)
RDW: 12.3 % (ref 11.5–15.5)
WBC: 4.3 10*3/uL (ref 4.0–10.5)
nRBC: 0 % (ref 0.0–0.2)

## 2019-09-14 LAB — TYPE AND SCREEN
ABO/RH(D): O POS
Antibody Screen: NEGATIVE

## 2019-09-14 LAB — ABO/RH: ABO/RH(D): O POS

## 2019-09-14 LAB — LIPASE, BLOOD: Lipase: 47 U/L (ref 11–51)

## 2019-09-14 MED ORDER — SUCRALFATE 1 G PO TABS: 1.0000 g | ORAL_TABLET | Freq: Three times a day (TID) | ORAL | 0 refills | Status: AC

## 2019-09-14 MED ORDER — SODIUM CHLORIDE 0.9 % IV BOLUS
1000.0000 mL | Freq: Once | INTRAVENOUS | Status: AC
  Administered 2019-09-14: 1000 mL via INTRAVENOUS

## 2019-09-14 MED ORDER — IOHEXOL 300 MG/ML  SOLN
100.0000 mL | Freq: Once | INTRAMUSCULAR | Status: AC | PRN
  Administered 2019-09-14: 14:00:00 100 mL via INTRAVENOUS

## 2019-09-14 MED ORDER — OMEPRAZOLE 20 MG PO CPDR: 20.0000 mg | DELAYED_RELEASE_CAPSULE | Freq: Every day | ORAL | 0 refills | Status: AC

## 2019-09-14 NOTE — ED Notes (Signed)
Pt aware of need for urine sample, urinal at bedside, call light within reach 

## 2019-09-14 NOTE — ED Notes (Signed)
Patient verbalizes understanding of discharge instructions. Opportunity for questioning and answers were provided. Armband removed by staff, pt discharged from ED.  

## 2019-09-14 NOTE — ED Triage Notes (Signed)
Patient states at work he had an episode of vomiting blood at work then while here in the ED he had an episode of rectal bleeding. C/o lower abdomina pain.

## 2019-09-14 NOTE — ED Notes (Signed)
Called pt name x2 with no response

## 2019-09-14 NOTE — Discharge Instructions (Signed)
As discussed, your evaluation today has been largely reassuring.  But, it is important that you monitor your condition carefully, and do not hesitate to return to the ED if you develop new, or concerning changes in your condition.  Drink alcohol only in moderation, and take medication as directed.  Otherwise, please follow-up with your physician for appropriate ongoing care.

## 2019-09-14 NOTE — ED Provider Notes (Signed)
[facility] EMERGENCY DEPARTMENT Provider Note   CSN: [id number] Arrival date & time: 09/14/19  1012     History   Chief Complaint Chief Complaint  Patient presents with  . Rectal Bleeding  . Emesis    HPI [facility] is a 43 y.o. male.     HPI Patient presents with abdominal pain, bloody stool and hematemesis. Patient notes that he has been in his usual state of health, including yesterday, when he felt generally well, but did not eat any food. He did however, drink liquids throughout the day, including soda. Today, without clear precipitant he had tenesmus. During his third attempt to have a bowel movement he was suddenly nauseous, had an episode of vomiting with hematemesis. Subsequently noticed blood in his stool. At some point during this development he developed pain in his left lower abdomen, described as mild, sore, crampy No fever, no confusion, disorientation, chest pain, dyspnea. No history of abdominal issues, including IBS, IBD. Since onset symptoms have improved somewhat without clear intervention. Past Medical History:  Diagnosis Date  . Asthma   . Hypertension     Patient Active Problem List   Diagnosis Date Noted  . Essential hypertension 12/05/2018  . Nonspecific ST-T wave electrocardiographic changes 12/05/2018    Past Surgical History:  Procedure Laterality Date  . NO PAST SURGERIES    . No prior surgery          Home Medications    Prior to Admission medications   Medication Sig Start Date End Date Taking? Authorizing Provider  omeprazole (PRILOSEC) 20 MG capsule Take 1 capsule (20 mg total) by mouth daily. Take one tablet daily 09/14/19   Carmin Muskrat, MD  sucralfate (CARAFATE) 1 g tablet Take 1 tablet (1 g total) by mouth 4 (four) times daily -  with meals and at bedtime. 09/14/19   Carmin Muskrat, MD    Family History Family History  Problem Relation Age of Onset  . Hypertension Mother     Social  History Social History   Tobacco Use  . Smoking status: Current Every Day Smoker    Packs/day: 0.25    Types: Cigarettes  . Smokeless tobacco: Never Used  Substance Use Topics  . Alcohol use: Yes    Comment: socially  . Drug use: No     Allergies   Lisinopril and Tramadol   Review of Systems Review of Systems  Constitutional:       Per HPI, otherwise negative  HENT:       Per HPI, otherwise negative  Respiratory:       Per HPI, otherwise negative  Cardiovascular:       Per HPI, otherwise negative  Gastrointestinal: Positive for abdominal pain, nausea and vomiting.  Endocrine:       Negative aside from HPI  Genitourinary:       Neg aside from HPI   Musculoskeletal:       Per HPI, otherwise negative  Skin: Negative.   Neurological: Negative for syncope.     Physical Exam Updated Vital Signs BP (!) 166/84   Pulse 88   Temp 99 F (37.2 C) (Oral)   Resp 18   SpO2 99%   Physical Exam Vitals signs and nursing note reviewed.  Constitutional:      General: He is not in acute distress.    Appearance: He is well-developed.  HENT:     Head: Normocephalic and atraumatic.  Eyes:     Conjunctiva/sclera: Conjunctivae  normal.  Cardiovascular:     Rate and Rhythm: Normal rate and regular rhythm.  Pulmonary:     Effort: Pulmonary effort is normal. No respiratory distress.     Breath sounds: No stridor.  Abdominal:     General: There is no distension.     Tenderness: There is abdominal tenderness in the left lower quadrant.  Skin:    General: Skin is warm and dry.  Neurological:     Mental Status: He is alert and oriented to person, place, and time.      ED Treatments / Results  Labs (all labs ordered are listed, but only abnormal results are displayed) Labs Reviewed  COMPREHENSIVE METABOLIC PANEL - Abnormal; Notable for the following components:      Result Value   Sodium 133 (*)    Potassium 3.1 (*)    Chloride 91 (*)    Glucose, Bld 123 (*)    BUN 5  (*)    Total Protein 8.5 (*)    AST 178 (*)    ALT 94 (*)    Anion gap 16 (*)    All other components within normal limits  URINALYSIS, ROUTINE W REFLEX MICROSCOPIC - Abnormal; Notable for the following components:   Specific Gravity, Urine >1.046 (*)    Hgb urine dipstick SMALL (*)    Bacteria, UA RARE (*)    All other components within normal limits  LIPASE, BLOOD  CBC  TYPE AND SCREEN  ABO/RH    EKG None  Radiology Ct Abdomen Pelvis W Contrast  Result Date: 09/14/2019 CLINICAL DATA:  Left lower quadrant abdominal pain, bloody stool and hematemesis. EXAM: CT ABDOMEN AND PELVIS WITH CONTRAST TECHNIQUE: Multidetector CT imaging of the abdomen and pelvis was performed using the standard protocol following bolus administration of intravenous contrast. CONTRAST:  OMNIPAQUE IOHEXOL 300 MG/ML  SOLN COMPARISON:  None. FINDINGS: Lower chest: Unremarkable. Hepatobiliary: Diffuse low density of the liver relative to the spleen. Normal appearing gallbladder Pancreas: Unremarkable. No pancreatic ductal dilatation or surrounding inflammatory changes. Spleen: Normal in size without focal abnormality. Adrenals/Urinary Tract: Adrenal glands are unremarkable. Kidneys are normal, without renal calculi, focal lesion, or hydronephrosis. Bladder is unremarkable. Stomach/Bowel: Unremarkable stomach, small bowel and colon. No evidence of appendicitis. Vascular/Lymphatic: No significant vascular findings are present. No enlarged abdominal or pelvic lymph nodes. Reproductive: Prostate is unremarkable. Other: No abdominal wall hernia or abnormality. No abdominopelvic ascites. Musculoskeletal: Minimal lumbar and lower thoracic spine degenerative changes. IMPRESSION: 1. No acute abnormality. 2. Diffuse hepatic steatosis. Electronically Signed   By: Beckie Salts M.D.   On: 09/14/2019 13:38    Procedures Procedures (including critical care time)  Medications Ordered in ED Medications  sodium chloride 0.9 %  bolus 1,000 mL (0 mLs Intravenous Stopped 09/14/19 1422)  iohexol (OMNIPAQUE) 300 MG/ML solution 100 mL (100 mLs Intravenous Contrast Given 09/14/19 1332)     Initial Impression / Assessment and Plan / ED Course  I have reviewed the triage vital signs and the nursing notes.  Pertinent labs & imaging results that were available during my care of the patient were reviewed by me and considered in my medical decision making (see chart for details).     On repeat exam the patient is awake, alert, sitting on the edge of his bed sitting upright, speaking clearly, in no distress. I reviewed all findings including evidence for hepatic steatosis, slight LFT abnormalities. Patient has no ongoing bleeding, no hemodynamic instability.  And there are some suspicion for  either alcohol-related varices or gastric irritation, with subsequent colonic irritation contributing to his abdominal pain given the absence of other notable findings on CT scan. With no ongoing bleeding, no hemic instability we discussed need for appropriate alcohol consumption, patient was started on PPI, Carafate, will follow up with outpatient providers.  Final Clinical Impressions(s) / ED Diagnoses   Final diagnoses:  Rectal bleeding  Hematemesis with nausea    ED Discharge Orders         Ordered    sucralfate (CARAFATE) 1 g tablet  3 times daily with meals & bedtime    Note to Pharmacy: Take for one week   09/14/19 1457    omeprazole (PRILOSEC) 20 MG capsule  Daily     09/14/19 1457           Gerhard MunchLockwood, [place], MD 09/14/19 1557

## 2020-04-09 ENCOUNTER — Encounter (HOSPITAL_COMMUNITY): Payer: Self-pay | Admitting: *Deleted

## 2020-04-09 ENCOUNTER — Emergency Department (HOSPITAL_COMMUNITY)
Admission: EM | Admit: 2020-04-09 | Discharge: 2020-04-09 | Disposition: A | Discharge status: Home / Self Care | Payer: Self-pay | Attending: Emergency Medicine | Admitting: Emergency Medicine

## 2020-04-09 ENCOUNTER — Other Ambulatory Visit: Payer: Self-pay

## 2020-04-09 DIAGNOSIS — Y939 Activity, unspecified: Secondary | ICD-10-CM | POA: Insufficient documentation

## 2020-04-09 DIAGNOSIS — Y999 Unspecified external cause status: Secondary | ICD-10-CM | POA: Insufficient documentation

## 2020-04-09 DIAGNOSIS — Z5321 Procedure and treatment not carried out due to patient leaving prior to being seen by health care provider: Secondary | ICD-10-CM | POA: Insufficient documentation

## 2020-04-09 DIAGNOSIS — Y929 Unspecified place or not applicable: Secondary | ICD-10-CM | POA: Insufficient documentation

## 2020-04-09 DIAGNOSIS — S61210A Laceration without foreign body of right index finger without damage to nail, initial encounter: Secondary | ICD-10-CM | POA: Insufficient documentation

## 2020-04-09 DIAGNOSIS — W268XXA Contact with other sharp object(s), not elsewhere classified, initial encounter: Secondary | ICD-10-CM | POA: Insufficient documentation

## 2020-04-09 LAB — CBC
HCT: 41.2 % (ref 39.0–52.0)
Hemoglobin: 14.1 g/dL (ref 13.0–17.0)
MCH: 32.8 pg (ref 26.0–34.0)
MCHC: 34.2 g/dL (ref 30.0–36.0)
MCV: 95.8 fL (ref 80.0–100.0)
Platelets: 80 10*3/uL — ABNORMAL LOW (ref 150–400)
RBC: 4.3 MIL/uL (ref 4.22–5.81)
RDW: 12.4 % (ref 11.5–15.5)
WBC: 2.6 10*3/uL — ABNORMAL LOW (ref 4.0–10.5)
nRBC: 0 % (ref 0.0–0.2)

## 2020-04-09 LAB — PROTIME-INR
INR: 1 (ref 0.8–1.2)
Prothrombin Time: 12.4 seconds (ref 11.4–15.2)

## 2020-04-09 MED ORDER — SODIUM CHLORIDE 0.9% FLUSH: 3.0000 mL | Freq: Once | INTRAVENOUS | Status: DC

## 2020-04-09 NOTE — ED Triage Notes (Signed)
The pt reports that he scraped his rt index finger on a trash can  A large amount of bleeding with his t-shirts wrapped around the wound  Pressure bandage placed on the wound  The pt has been drinking alcohol

## 2022-02-11 ENCOUNTER — Other Ambulatory Visit: Payer: Self-pay

## 2022-02-11 ENCOUNTER — Encounter (HOSPITAL_COMMUNITY): Payer: Self-pay | Admitting: Emergency Medicine

## 2022-02-11 ENCOUNTER — Emergency Department (HOSPITAL_COMMUNITY)
Admission: EM | Admit: 2022-02-11 | Discharge: 2022-02-11 | Disposition: A | Discharge status: Home / Self Care | Payer: Self-pay | Attending: Emergency Medicine | Admitting: Emergency Medicine

## 2022-02-11 ENCOUNTER — Emergency Department (HOSPITAL_COMMUNITY): Payer: Self-pay

## 2022-02-11 DIAGNOSIS — H9201 Otalgia, right ear: Secondary | ICD-10-CM | POA: Insufficient documentation

## 2022-02-11 DIAGNOSIS — J45909 Unspecified asthma, uncomplicated: Secondary | ICD-10-CM | POA: Insufficient documentation

## 2022-02-11 DIAGNOSIS — F17200 Nicotine dependence, unspecified, uncomplicated: Secondary | ICD-10-CM | POA: Insufficient documentation

## 2022-02-11 DIAGNOSIS — H538 Other visual disturbances: Secondary | ICD-10-CM | POA: Insufficient documentation

## 2022-02-11 DIAGNOSIS — I10 Essential (primary) hypertension: Secondary | ICD-10-CM | POA: Insufficient documentation

## 2022-02-11 DIAGNOSIS — Z79899 Other long term (current) drug therapy: Secondary | ICD-10-CM | POA: Insufficient documentation

## 2022-02-11 DIAGNOSIS — R739 Hyperglycemia, unspecified: Secondary | ICD-10-CM | POA: Insufficient documentation

## 2022-02-11 LAB — CBC WITH DIFFERENTIAL/PLATELET
Abs Immature Granulocytes: 0.01 10*3/uL (ref 0.00–0.07)
Basophils Absolute: 0.1 10*3/uL (ref 0.0–0.1)
Basophils Relative: 3 %
Eosinophils Absolute: 0.2 10*3/uL (ref 0.0–0.5)
Eosinophils Relative: 6 %
HCT: 41.2 % (ref 39.0–52.0)
Hemoglobin: 14.1 g/dL (ref 13.0–17.0)
Immature Granulocytes: 0 %
Lymphocytes Relative: 46 %
Lymphs Abs: 1.5 10*3/uL (ref 0.7–4.0)
MCH: 32.5 pg (ref 26.0–34.0)
MCHC: 34.2 g/dL (ref 30.0–36.0)
MCV: 94.9 fL (ref 80.0–100.0)
Monocytes Absolute: 0.4 10*3/uL (ref 0.1–1.0)
Monocytes Relative: 13 %
Neutro Abs: 1 10*3/uL — ABNORMAL LOW (ref 1.7–7.7)
Neutrophils Relative %: 32 %
Platelets: 210 10*3/uL (ref 150–400)
RBC: 4.34 MIL/uL (ref 4.22–5.81)
RDW: 13.7 % (ref 11.5–15.5)
WBC: 3.2 10*3/uL — ABNORMAL LOW (ref 4.0–10.5)
nRBC: 0 % (ref 0.0–0.2)

## 2022-02-11 LAB — BASIC METABOLIC PANEL
Anion gap: 10 (ref 5–15)
BUN: 7 mg/dL (ref 6–20)
CO2: 27 mmol/L (ref 22–32)
Calcium: 9.7 mg/dL (ref 8.9–10.3)
Chloride: 102 mmol/L (ref 98–111)
Creatinine, Ser: 0.79 mg/dL (ref 0.61–1.24)
GFR, Estimated: >60 mL/min (ref >60)
Glucose, Bld: 66 mg/dL — ABNORMAL LOW (ref 70–99)
Potassium: 4 mmol/L (ref 3.5–5.1)
Sodium: 139 mmol/L (ref 135–145)

## 2022-02-11 LAB — CBG MONITORING, ED: Glucose-Capillary: 123 mg/dL — ABNORMAL HIGH (ref 70–99)

## 2022-02-11 MED ORDER — POTASSIUM CHLORIDE CRYS ER 10 MEQ PO TBCR
10.0000 meq | EXTENDED_RELEASE_TABLET | Freq: Once | ORAL | Status: AC
  Administered 2022-02-11: 10 meq via ORAL
  Filled 2022-02-11: qty 1

## 2022-02-11 MED ORDER — IOHEXOL 350 MG/ML SOLN
75.0000 mL | Freq: Once | INTRAVENOUS | Status: AC | PRN
  Administered 2022-02-11: 75 mL via INTRAVENOUS

## 2022-02-11 MED ORDER — HYDROCHLOROTHIAZIDE 12.5 MG PO TABS
12.5000 mg | ORAL_TABLET | Freq: Every day | ORAL | Status: DC
  Administered 2022-02-11: 12.5 mg via ORAL
  Filled 2022-02-11: qty 1

## 2022-02-11 MED ORDER — AMLODIPINE BESYLATE 5 MG PO TABS: 5.0000 mg | ORAL_TABLET | Freq: Every day | ORAL | 0 refills | Status: AC

## 2022-02-11 MED ORDER — CLONIDINE HCL 0.1 MG PO TABS
0.1000 mg | ORAL_TABLET | Freq: Once | ORAL | Status: AC
  Administered 2022-02-11: 0.1 mg via ORAL
  Filled 2022-02-11: qty 1
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
## 2022-02-11 NOTE — ED Notes (Signed)
EDP notified of BP 200/144  ?

## 2022-02-11 NOTE — ED Notes (Signed)
Pt denies N/V, photophobia, vision changes w/HA. ?
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
## 2022-02-11 NOTE — Discharge Instructions (Addendum)
I am prescribing you a medication called amlodipine.  Please take this once per day.  This will help with your blood pressure.  Below is the contact information for [facility] and wellness.  Please give them a call at your earliest convenience to schedule an appointment for reevaluation.  You need to have your blood pressure rechecked and will likely need additional medications to properly manage your blood pressure. ? ?If you develop any new or worsening symptoms whatsoever please come back to the emergency department for reevaluation. ?

## 2022-04-14 IMAGING — CT CT HEAD W/O CM
4 series · 16 of 47 positions shown, 18 images · non-contrast
Comparison: None.

CLINICAL DATA: Headache.



[Series 3: head without · axial · non-contrast · 0.44mm/px · z∈[-30,+90]mm · 7 of 33 slices shown, 9 images]
[im 5/33  brain]
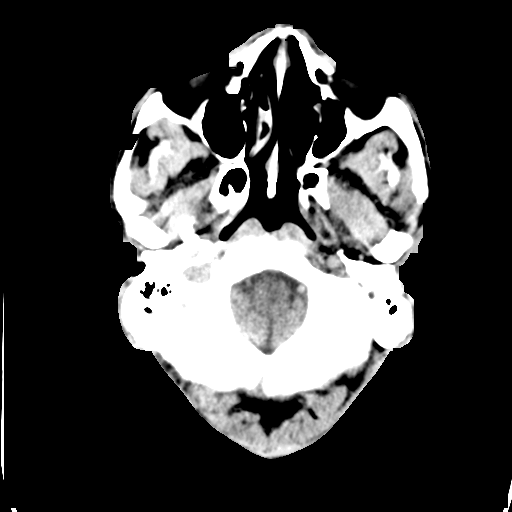
[im 5/33  bone]
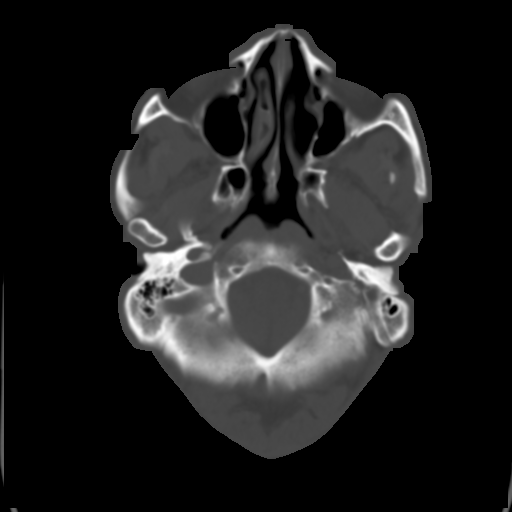
[im 9/33  brain]
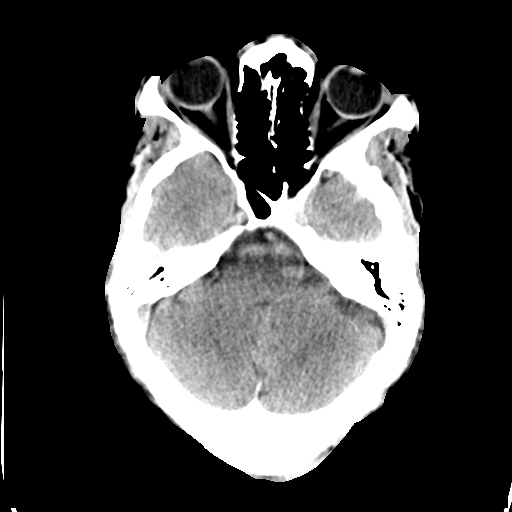
[im 13/33  brain]
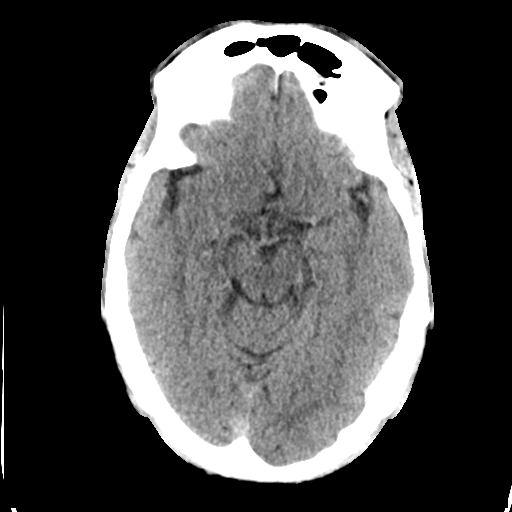
[im 17/33  brain]
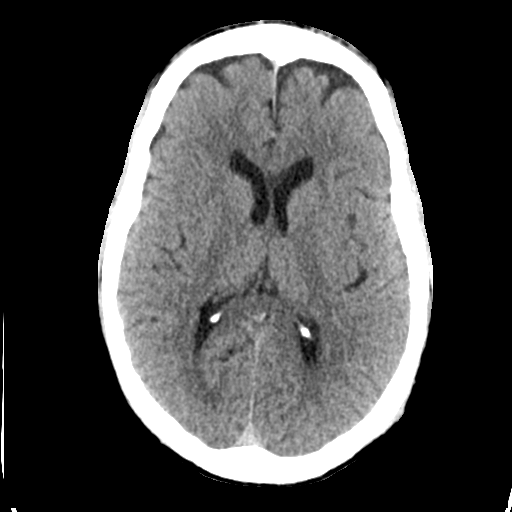
[im 21/33  brain]
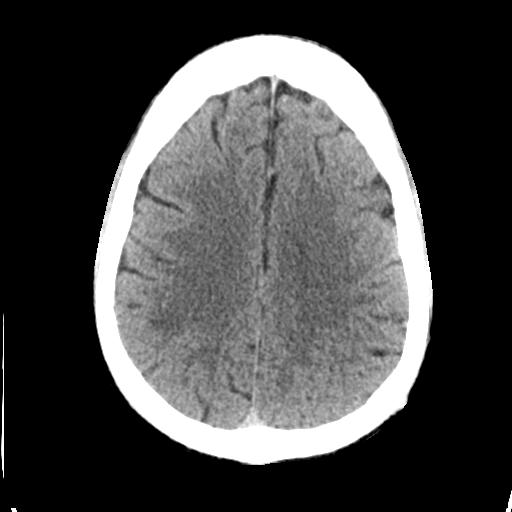
[im 21/33  bone]
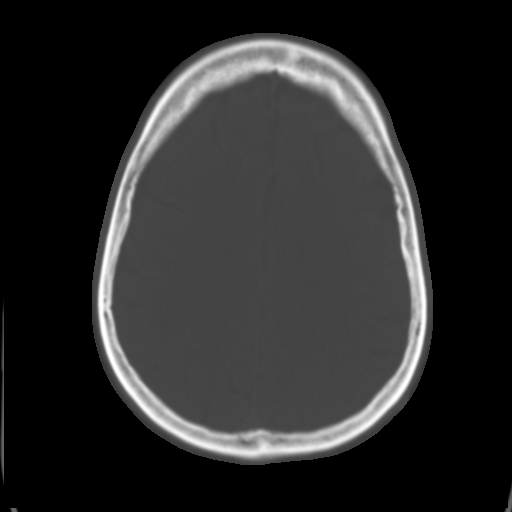
[im 25/33  brain]
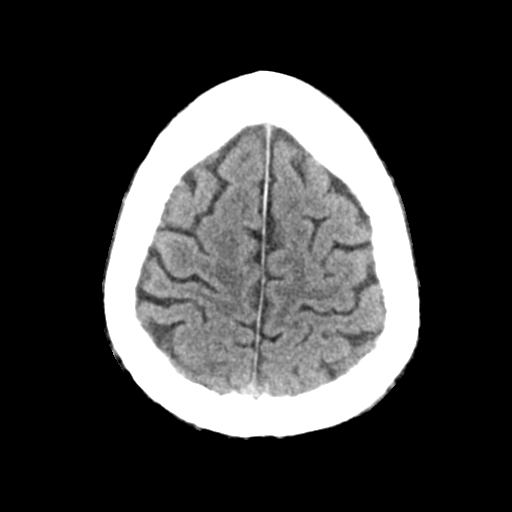
[im 29/33  brain]
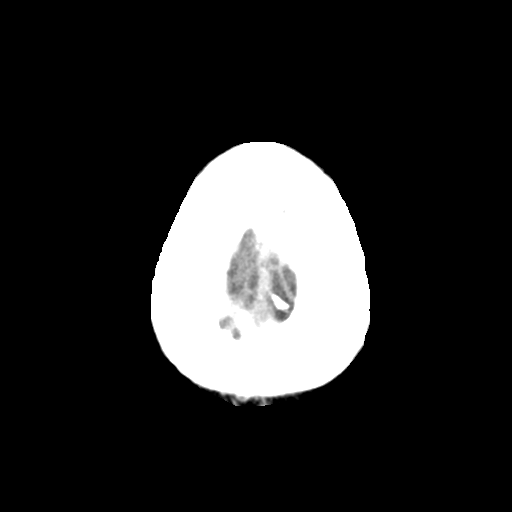

[Series 4: head bone · axial · 0.44mm/px · z∈[-34,-2]mm · 3 of 82 slices shown]
[im 9/82  bone]
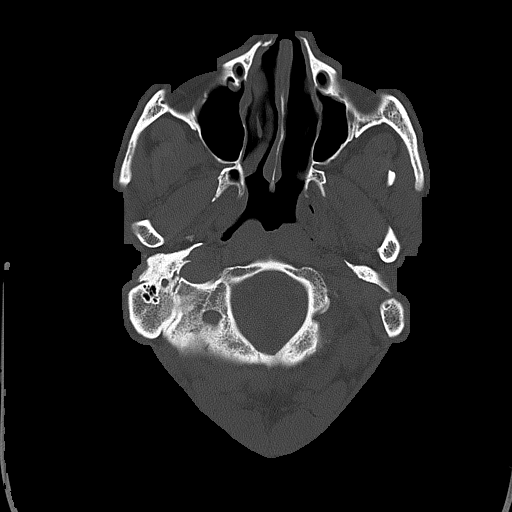
[im 17/82  bone]
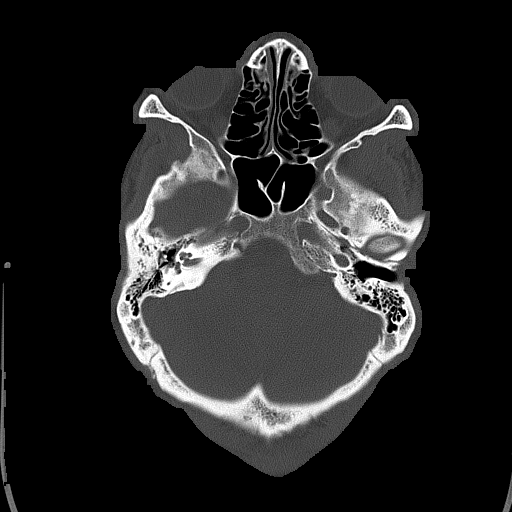
[im 25/82  bone]
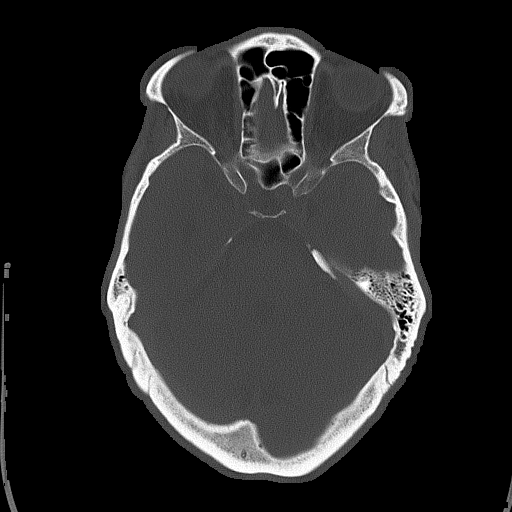

[Series 5: head without cor · coronal · non-contrast · 0.32mm/px · 3 of 71 slices shown]
[im 24/71  brain]
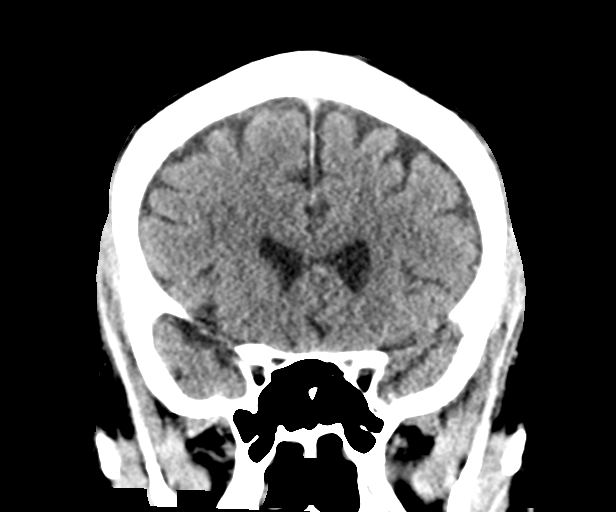
[im 32/71  brain]
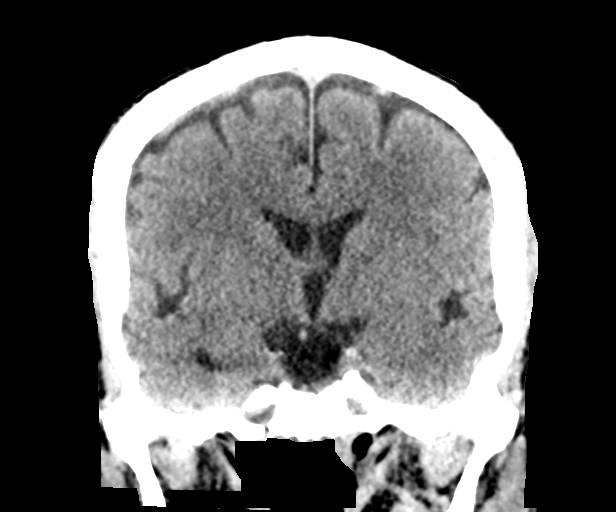
[im 39/71  brain]
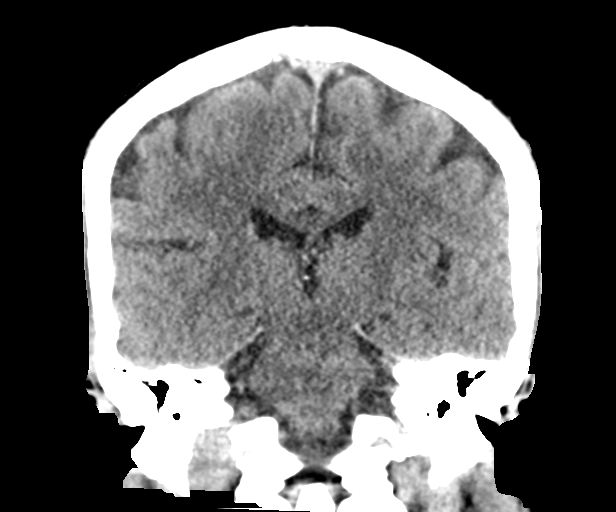

[Series 6: head without sag · sagittal · non-contrast · 0.32mm/px · 3 of 66 slices shown]
[im 22/66  brain]
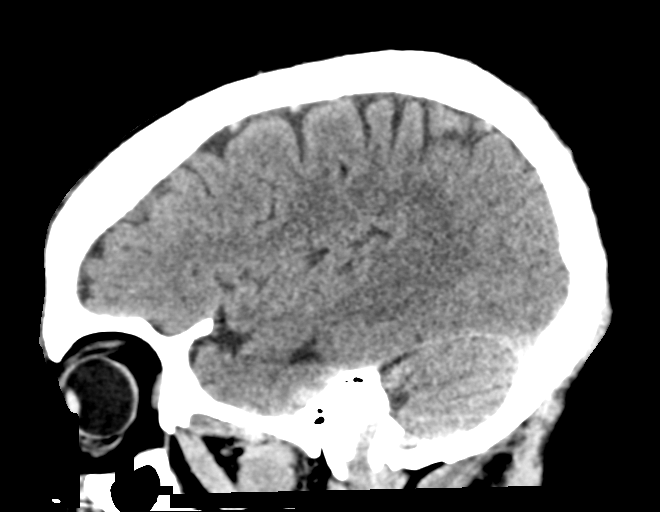
[im 33/66  brain]
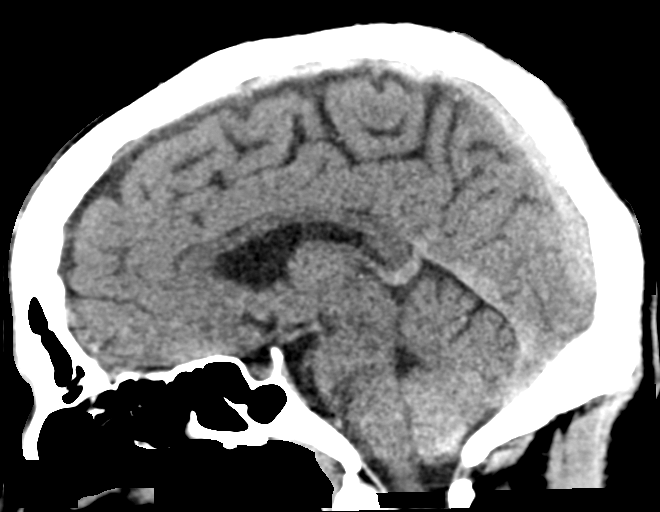
[im 44/66  brain]
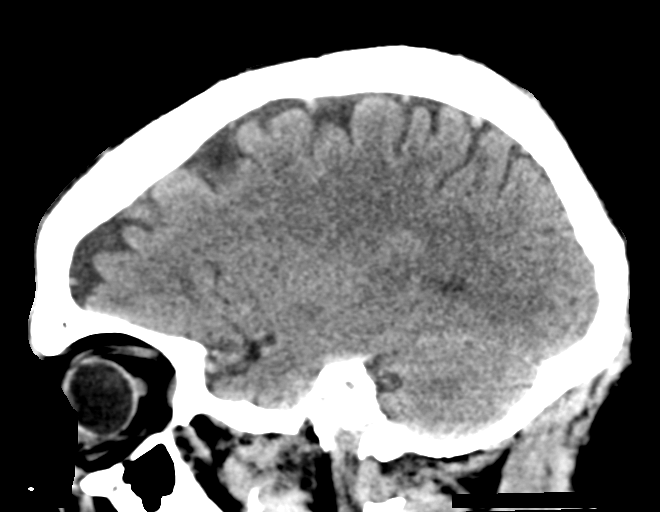

[16 of 47 positions shown; findings below may reference images not displayed]

FINDINGS: Brain: No subdural, epidural, or subarachnoid hemorrhage. No mass
effect or midline shift. Ventricles and sulci are unremarkable.
Brainstem, basal cisterns, and cerebellum are normal. No evidence of
acute cortical ischemia or infarct.

Vascular: No hyperdense vessel or unexpected calcification.

Skull: Normal. Negative for fracture or focal lesion.

Sinuses/Orbits: No acute finding.

Other: No other abnormalities.
IMPRESSION: 1. No acute intracranial abnormalities. No cause for headache
identified.

## 2022-04-20 ENCOUNTER — Ambulatory Visit (INDEPENDENT_AMBULATORY_CARE_PROVIDER_SITE_OTHER): Payer: Self-pay | Admitting: Primary Care

## 2022-07-10 ENCOUNTER — Ambulatory Visit: Payer: No Typology Code available for payment source | Admitting: Physician Assistant

## 2022-07-10 ENCOUNTER — Encounter: Payer: Self-pay | Admitting: Physician Assistant

## 2022-07-10 VITALS — BP 149/120 | HR 118 | Resp 18 | Ht 66.0 in | Wt 123.0 lb

## 2022-07-10 DIAGNOSIS — I10 Essential (primary) hypertension: Secondary | ICD-10-CM

## 2022-07-10 NOTE — Progress Notes (Signed)
Patient refused to stay on unit after having blood pressure checked.  This provider did speak with patient and encouraged him to stay for full visit, patient was adamant about leaving, states that he did not want to take any medication for his blood pressure just wanted to know what his blood pressure was.  Roney Jaffe, PA-C Physician Assistant [facility] [email or web address]

## 2022-07-10 NOTE — Progress Notes (Signed)
Pt has not had anything to eat or drink today. After vitals checked pt said he did not want to see the doctor and wanted to leave. Stated he only wanted to have blood pressure checked. Pt made aware that blood pressure was elevated and doctor was available to speak with him. Pt refused and left.

## 2022-11-18 DIAGNOSIS — H5213 Myopia, bilateral: Secondary | ICD-10-CM | POA: Diagnosis not present

## 2023-01-04 DIAGNOSIS — R441 Visual hallucinations: Secondary | ICD-10-CM | POA: Diagnosis not present

## 2023-01-04 DIAGNOSIS — N3090 Cystitis, unspecified without hematuria: Secondary | ICD-10-CM | POA: Diagnosis not present

## 2023-01-04 DIAGNOSIS — R443 Hallucinations, unspecified: Secondary | ICD-10-CM | POA: Diagnosis not present

## 2023-01-04 DIAGNOSIS — R440 Auditory hallucinations: Secondary | ICD-10-CM | POA: Diagnosis not present

## 2023-01-04 DIAGNOSIS — F10939 Alcohol use, unspecified with withdrawal, unspecified: Secondary | ICD-10-CM | POA: Diagnosis not present

## 2023-01-04 DIAGNOSIS — R0602 Shortness of breath: Secondary | ICD-10-CM | POA: Diagnosis not present

## 2023-01-04 DIAGNOSIS — Z20822 Contact with and (suspected) exposure to covid-19: Secondary | ICD-10-CM | POA: Diagnosis not present

## 2023-01-04 DIAGNOSIS — J209 Acute bronchitis, unspecified: Secondary | ICD-10-CM | POA: Diagnosis not present

## 2023-01-05 DIAGNOSIS — R8271 Bacteriuria: Secondary | ICD-10-CM | POA: Diagnosis not present

## 2023-01-05 DIAGNOSIS — F17210 Nicotine dependence, cigarettes, uncomplicated: Secondary | ICD-10-CM | POA: Diagnosis not present

## 2023-01-05 DIAGNOSIS — R441 Visual hallucinations: Secondary | ICD-10-CM | POA: Diagnosis not present

## 2023-01-05 DIAGNOSIS — R410 Disorientation, unspecified: Secondary | ICD-10-CM | POA: Diagnosis not present

## 2023-01-05 DIAGNOSIS — R0602 Shortness of breath: Secondary | ICD-10-CM | POA: Diagnosis not present

## 2023-01-05 DIAGNOSIS — R440 Auditory hallucinations: Secondary | ICD-10-CM | POA: Diagnosis not present

## 2023-01-05 DIAGNOSIS — E876 Hypokalemia: Secondary | ICD-10-CM | POA: Diagnosis not present

## 2023-01-05 DIAGNOSIS — F10932 Alcohol use, unspecified with withdrawal with perceptual disturbance: Secondary | ICD-10-CM | POA: Diagnosis not present

## 2023-01-05 DIAGNOSIS — F10939 Alcohol use, unspecified with withdrawal, unspecified: Secondary | ICD-10-CM | POA: Diagnosis not present

## 2023-01-06 DIAGNOSIS — R9431 Abnormal electrocardiogram [ECG] [EKG]: Secondary | ICD-10-CM | POA: Diagnosis not present

## 2023-01-07 DIAGNOSIS — R000 Tachycardia, unspecified: Secondary | ICD-10-CM | POA: Diagnosis not present

## 2023-09-03 ENCOUNTER — Emergency Department (HOSPITAL_COMMUNITY): Payer: No Typology Code available for payment source

## 2023-09-03 ENCOUNTER — Encounter (HOSPITAL_COMMUNITY): Payer: Self-pay

## 2023-09-03 ENCOUNTER — Other Ambulatory Visit: Payer: Self-pay

## 2023-09-03 ENCOUNTER — Emergency Department (HOSPITAL_COMMUNITY)
Admission: EM | Admit: 2023-09-03 | Discharge: 2023-09-03 | Disposition: A | Discharge status: Home / Self Care | Payer: No Typology Code available for payment source

## 2023-09-03 DIAGNOSIS — J45909 Unspecified asthma, uncomplicated: Secondary | ICD-10-CM | POA: Diagnosis not present

## 2023-09-03 DIAGNOSIS — Z20822 Contact with and (suspected) exposure to covid-19: Secondary | ICD-10-CM | POA: Insufficient documentation

## 2023-09-03 DIAGNOSIS — J45901 Unspecified asthma with (acute) exacerbation: Secondary | ICD-10-CM | POA: Insufficient documentation

## 2023-09-03 DIAGNOSIS — R0602 Shortness of breath: Secondary | ICD-10-CM | POA: Diagnosis present

## 2023-09-03 DIAGNOSIS — R059 Cough, unspecified: Secondary | ICD-10-CM | POA: Diagnosis not present

## 2023-09-03 LAB — COMPREHENSIVE METABOLIC PANEL
ALT: 21 U/L (ref 0–44)
AST: 38 U/L (ref 15–41)
Albumin: 4.2 g/dL (ref 3.5–5.0)
Alkaline Phosphatase: 63 U/L (ref 38–126)
Anion gap: 17 — ABNORMAL HIGH (ref 5–15)
BUN: 8 mg/dL (ref 6–20)
CO2: 28 mmol/L (ref 22–32)
Calcium: 9.5 mg/dL (ref 8.9–10.3)
Chloride: 92 mmol/L — ABNORMAL LOW (ref 98–111)
Creatinine, Ser: 0.93 mg/dL (ref 0.61–1.24)
GFR, Estimated: >60 mL/min (ref >60)
Glucose, Bld: 93 mg/dL (ref 70–99)
Potassium: 2.9 mmol/L — ABNORMAL LOW (ref 3.5–5.1)
Sodium: 137 mmol/L (ref 135–145)
Total Bilirubin: 0.2 mg/dL — ABNORMAL LOW (ref 0.3–1.2)
Total Protein: 8.4 g/dL — ABNORMAL HIGH (ref 6.5–8.1)

## 2023-09-03 LAB — CBC WITH DIFFERENTIAL/PLATELET
Abs Immature Granulocytes: 0 10*3/uL (ref 0.00–0.07)
Basophils Absolute: 0.1 10*3/uL (ref 0.0–0.1)
Basophils Relative: 2 %
Eosinophils Absolute: 0.1 10*3/uL (ref 0.0–0.5)
Eosinophils Relative: 1 %
HCT: 46.6 % (ref 39.0–52.0)
Hemoglobin: 15.7 g/dL (ref 13.0–17.0)
Lymphocytes Relative: 43 %
Lymphs Abs: 2.5 10*3/uL (ref 0.7–4.0)
MCH: 32.4 pg (ref 26.0–34.0)
MCHC: 33.7 g/dL (ref 30.0–36.0)
MCV: 96.3 fL (ref 80.0–100.0)
Monocytes Absolute: 0.2 10*3/uL (ref 0.1–1.0)
Monocytes Relative: 3 %
Neutro Abs: 2.9 10*3/uL (ref 1.7–7.7)
Neutrophils Relative %: 51 %
Platelets: 204 10*3/uL (ref 150–400)
RBC: 4.84 MIL/uL (ref 4.22–5.81)
RDW: 12.7 % (ref 11.5–15.5)
WBC: 5.7 10*3/uL (ref 4.0–10.5)
nRBC: 0 % (ref 0.0–0.2)
nRBC: 0 /100{WBCs}

## 2023-09-03 LAB — SARS CORONAVIRUS 2 BY RT PCR: SARS Coronavirus 2 by RT PCR: NEGATIVE

## 2023-09-03 LAB — TROPONIN I (HIGH SENSITIVITY)
Troponin I (High Sensitivity): 6 ng/L (ref <18)
Troponin I (High Sensitivity): 6 ng/L (ref <18)

## 2023-09-03 LAB — D-DIMER, QUANTITATIVE: D-Dimer, Quant: 0.3 ug{FEU}/mL (ref 0.00–0.50)

## 2023-09-03 LAB — BRAIN NATRIURETIC PEPTIDE: B Natriuretic Peptide: 18.4 pg/mL (ref 0.0–100.0)

## 2023-09-03 MED ORDER — ALBUTEROL SULFATE HFA 108 (90 BASE) MCG/ACT IN AERS
2.0000 | INHALATION_SPRAY | RESPIRATORY_TRACT | Status: DC | PRN
  Administered 2023-09-03: 2 via RESPIRATORY_TRACT
  Filled 2023-09-03: qty 6.7

## 2023-09-03 MED ORDER — PREDNISONE 20 MG PO TABS
60.0000 mg | ORAL_TABLET | Freq: Once | ORAL | Status: AC
  Administered 2023-09-03: 60 mg via ORAL
  Filled 2023-09-03: qty 3

## 2023-09-03 MED ORDER — PREDNISONE 10 MG PO TABS: 40.0000 mg | ORAL_TABLET | Freq: Every day | ORAL | 0 refills | Status: AC

## 2023-09-03 MED ORDER — IPRATROPIUM-ALBUTEROL 0.5-2.5 (3) MG/3ML IN SOLN
3.0000 mL | Freq: Once | RESPIRATORY_TRACT | Status: AC
Start: 2023-09-03 — End: 2023-09-03
  Administered 2023-09-03: 3 mL via RESPIRATORY_TRACT
  Filled 2023-09-03: qty 3

## 2023-09-03 MED ORDER — POTASSIUM CHLORIDE CRYS ER 20 MEQ PO TBCR
40.0000 meq | EXTENDED_RELEASE_TABLET | Freq: Once | ORAL | Status: AC
  Administered 2023-09-03: 40 meq via ORAL
  Filled 2023-09-03: qty 2

## 2023-09-03 MED ORDER — VENTOLIN HFA 108 (90 BASE) MCG/ACT IN AERS: 2.0000 | INHALATION_SPRAY | RESPIRATORY_TRACT | 0 refills | Status: AC | PRN

## 2023-09-03 NOTE — ED Provider Notes (Signed)
I was brought the patient's ecg from triage at 0105. New ST changes in inferior leads compared to 2020. Triage notes only sob/asthma like symptoms.   Vitals:   09/03/23 0053 09/03/23 0057 09/03/23 0058  BP: (!) 141/128    Pulse: (!) 103    Resp: 19    Temp: 97.7 F (36.5 C)    SpO2: 93% 93%   Weight:   66.7 kg  Height:   5\' 6"  (1.676 m)     Will get d dimer, trops, cxr, repeat ecg in 10 minutes and reevaluate need for room or able to wait.    Labs Reviewed  COMPREHENSIVE METABOLIC PANEL - Abnormal; Notable for the following components:      Result Value   Potassium 2.9 (*)    Chloride 92 (*)    Total Protein 8.4 (*)    Total Bilirubin 0.2 (*)    Anion gap 17 (*)    All other components within normal limits  SARS CORONAVIRUS 2 BY RT PCR  CBC WITH DIFFERENTIAL/PLATELET  BRAIN NATRIURETIC PEPTIDE  D-DIMER, QUANTITATIVE  TROPONIN I (HIGH SENSITIVITY)  TROPONIN I (HIGH SENSITIVITY)     Repeat ecg with mild change, but not worrisome for ACS at this time.  Troponin, BNP and d dimer all reassuring. With this information and VS, appears to be stable to wait for full evaluation and disposition.    Khylee Algeo, Barbara Cower, MD 09/03/23 [phone]

## 2023-09-03 NOTE — Discharge Instructions (Addendum)
Please follow-up with your primary doctor.  If you do not have one, you may use the number we have provided you to help find one.  Return if you develop fevers, chills, chest pain, worsening shortness of breath or any new or worsening symptoms that are concerning to you.

## 2023-09-03 NOTE — ED Provider Notes (Signed)
[place] EMERGENCY DEPARTMENT AT [facility] Provider Note   CSN: [id number] Arrival date & time: 09/03/23  7829     History  Chief Complaint  Patient presents with  . Shortness of Breath    VARUN JOURDAN is a 47 y.o. male.  He reports history of asthma presenting emergency department for acute onset shortness of breath last night.  No fevers, chills, chest pain.  No rhinorrhea or congestion.   Shortness of Breath      Home Medications Prior to Admission medications   Medication Sig Start Date End Date Taking? Authorizing Provider  amLODipine (NORVASC) 5 MG tablet Take 1 tablet (5 mg total) by mouth daily. Patient not taking: Reported on 07/10/2022 02/11/22   Placido Sou, PA-C  omeprazole (PRILOSEC) 20 MG capsule Take 1 capsule (20 mg total) by mouth daily. Take one tablet daily Patient not taking: Reported on 07/10/2022 09/14/19   Gerhard Munch, MD  sucralfate (CARAFATE) 1 g tablet Take 1 tablet (1 g total) by mouth 4 (four) times daily -  with meals and at bedtime. Patient not taking: Reported on 07/10/2022 09/14/19   Gerhard Munch, MD      Allergies    Lisinopril and Tramadol    Review of Systems   Review of Systems  Respiratory:  Positive for shortness of breath.     Physical Exam Updated Vital Signs BP (!) 168/115   Pulse 81   Temp 97.7 F (36.5 C)   Resp (!) 22   Ht 5\' 6"  (1.676 m)   Wt 66.7 kg   SpO2 97%   BMI 23.73 kg/m  Physical Exam Vitals reviewed.  Constitutional:      General: He is not in acute distress.    Appearance: He is not toxic-appearing.  HENT:     Head: Normocephalic.  Cardiovascular:     Rate and Rhythm: Normal rate and regular rhythm.  Pulmonary:     Effort: Pulmonary effort is normal. No tachypnea, accessory muscle usage or respiratory distress.     Breath sounds: Wheezing present.  Musculoskeletal:     Cervical back: Normal range of motion.  Skin:    General: Skin is warm.     Capillary Refill:  Capillary refill takes less than 2 seconds.  Neurological:     Mental Status: He is alert and oriented to person, place, and time.  Psychiatric:        Mood and Affect: Mood normal.        Behavior: Behavior normal.     ED Results / Procedures / Treatments   Labs (all labs ordered are listed, but only abnormal results are displayed) Labs Reviewed  COMPREHENSIVE METABOLIC PANEL - Abnormal; Notable for the following components:      Result Value   Potassium 2.9 (*)    Chloride 92 (*)    Total Protein 8.4 (*)    Total Bilirubin 0.2 (*)    Anion gap 17 (*)    All other components within normal limits  SARS CORONAVIRUS 2 BY RT PCR  CBC WITH DIFFERENTIAL/PLATELET  BRAIN NATRIURETIC PEPTIDE  D-DIMER, QUANTITATIVE  TROPONIN I (HIGH SENSITIVITY)  TROPONIN I (HIGH SENSITIVITY)    EKG EKG Interpretation Date/Time:  Tuesday September 03 2023 01:16:33 EDT Ventricular Rate:  109 PR Interval:  120 QRS Duration:  92 QT Interval:  390 QTC Calculation: 525 R Axis:   83  Text Interpretation: Sinus tachycardia Biatrial enlargement T wave abnormality, consider inferior ischemia Prolonged QT Abnormal ECG  When compared with ECG of 03-Sep-2023 00:45, PREVIOUS ECG IS PRESENT T waves in inferior leads are now more biphasic/normal relative to earlier in day. Confirmed by Marily Memos [id number]) on 09/03/2023 2:40:53 AM  Radiology DG Chest 2 View  Result Date: 09/03/2023 CLINICAL DATA:  Cough. EXAM: CHEST - 2 VIEW COMPARISON:  Chest radiograph dated 01/05/2023. FINDINGS: The heart size and mediastinal contours are within normal limits. Both lungs are clear. The visualized skeletal structures are unremarkable. IMPRESSION: No active cardiopulmonary disease. Electronically Signed   By: Elgie Collard M.D.   On: 09/03/2023 01:51    Procedures Procedures    Medications Ordered in ED Medications  albuterol (VENTOLIN HFA) 108 (90 Base) MCG/ACT inhaler 2 puff (2 puffs Inhalation Given 09/03/23 0103)   potassium chloride SA (KLOR-CON M) CR tablet 40 mEq (has no administration in time range)  predniSONE (DELTASONE) tablet 60 mg (has no administration in time range)  ipratropium-albuterol (DUONEB) 0.5-2.5 (3) MG/3ML nebulizer solution 3 mL (has no administration in time range)  ipratropium-albuterol (DUONEB) 0.5-2.5 (3) MG/3ML nebulizer solution 3 mL (3 mLs Nebulization Given 09/03/23 0654)    ED Course/ Medical Decision Making/ A&P Clinical Course as of 09/03/23 1010  Tue Sep 03, 2023  0901 DG Chest 2 View [RR]  0915 Echo in 2020 per chart review with normal EF.  [TY]  0916 Troponin I (High Sensitivity): 6 Negative troponin x 2 [TY]  0916 D-Dimer, Quant: 0.30 PE/DVT less likely [TY]  0918 Potassium(!): 2.9 repleted [TY]  0918 B Natriuretic Peptide: 18.4 Normal EF. Not elevated; acute CHF unlikely  [TY]  0919 Pulse Rate: 81 [TY]  0919 DG Chest 2 View MPRESSION: No active cardiopulmonary disease.  On my review; agree with radiology no PNA or active process.  [TY]  [id number] ED EKG Reviewed; LVH pattern.  Sinus rhythm.  No ST changes concerning for ischemia [TY]  1009 DuoNeb complete.  Lungs with improved sounds.  Will discharge at this time with short course of steroids and refill of his albuterol. [TY]    Clinical Course User Index [RR] Achille Rich, PA-C [TY] Coral Spikes, DO                                 Medical Decision Making Well-appearing 47 year old male presented emergency department for shortness of breath in the setting of asthma.  Vital signs overall reassuring.  Does not appear to be in overt respiratory distress.  Reports improvement in his breathing after breathing treatments in triage, but still has some mild shortness of breath.  Diffuse wheezing on exam.  Will give steroid and breathing treatment.  Labs as noted in ED course also reassuring.  Patient has been stable while awaiting evaluation for close to 8 hours.  Patient reports being out of albuterol at the  house.  Will get refill prescription.  Stable for discharge after breathing treatment.  Amount and/or Complexity of Data Reviewed Labs:  Decision-making details documented in ED Course. Radiology: ordered. Decision-making details documented in ED Course. ECG/medicine tests:  Decision-making details documented in ED Course.  Risk Prescription drug management.          Final Clinical Impression(s) / ED Diagnoses Final diagnoses:  None    Rx / DC Orders ED Discharge Orders     None         Coral Spikes, DO 09/03/23 6045

## 2023-09-03 NOTE — ED Triage Notes (Signed)
Pt complaining of shortness of breath that started 2-3 days ago. Cough for the last 2 days which is dry. Has not had his albuterol for a couple of months.
